# Patient Record
Sex: Female | Born: 1975 | Race: White | Hispanic: Yes | Marital: Single | Smoking: Never smoker
Health system: Southern US, Community
[De-identification: ages and names within clinical notes are randomized; demographics above are authoritative.]

## PROBLEM LIST (undated history)

## (undated) DIAGNOSIS — J45909 Unspecified asthma, uncomplicated: Secondary | ICD-10-CM

## (undated) DIAGNOSIS — I10 Essential (primary) hypertension: Secondary | ICD-10-CM

## (undated) HISTORY — PX: APPENDECTOMY: SHX54

## (undated) HISTORY — PX: TUBAL LIGATION: SHX77

---

## 2001-10-30 ENCOUNTER — Inpatient Hospital Stay (HOSPITAL_COMMUNITY): Admission: AD | Admit: 2001-10-30 | Discharge: 2001-10-30 | Payer: Self-pay | Admitting: *Deleted

## 2001-12-20 ENCOUNTER — Ambulatory Visit (HOSPITAL_COMMUNITY): Admission: RE | Admit: 2001-12-20 | Discharge: 2001-12-20 | Payer: Self-pay | Admitting: *Deleted

## 2002-01-31 ENCOUNTER — Encounter: Payer: Self-pay | Admitting: *Deleted

## 2002-01-31 ENCOUNTER — Ambulatory Visit (HOSPITAL_COMMUNITY): Admission: RE | Admit: 2002-01-31 | Discharge: 2002-01-31 | Payer: Self-pay | Admitting: *Deleted

## 2002-03-28 ENCOUNTER — Inpatient Hospital Stay (HOSPITAL_COMMUNITY): Admission: AD | Admit: 2002-03-28 | Discharge: 2002-03-31 | Payer: Self-pay | Admitting: *Deleted

## 2002-04-22 ENCOUNTER — Emergency Department (HOSPITAL_COMMUNITY): Admission: EM | Admit: 2002-04-22 | Discharge: 2002-04-22 | Payer: Self-pay

## 2003-01-30 ENCOUNTER — Emergency Department (HOSPITAL_COMMUNITY): Admission: EM | Admit: 2003-01-30 | Discharge: 2003-01-30 | Payer: Self-pay | Admitting: Emergency Medicine

## 2005-01-24 ENCOUNTER — Ambulatory Visit (HOSPITAL_COMMUNITY): Admission: RE | Admit: 2005-01-24 | Discharge: 2005-01-24 | Payer: Self-pay | Admitting: *Deleted

## 2005-02-28 ENCOUNTER — Inpatient Hospital Stay (HOSPITAL_COMMUNITY): Admission: RE | Admit: 2005-02-28 | Discharge: 2005-02-28 | Payer: Self-pay | Admitting: *Deleted

## 2005-06-20 ENCOUNTER — Inpatient Hospital Stay (HOSPITAL_COMMUNITY): Admission: AD | Admit: 2005-06-20 | Discharge: 2005-06-20 | Payer: Self-pay | Admitting: Obstetrics and Gynecology

## 2005-06-21 ENCOUNTER — Ambulatory Visit: Payer: Self-pay | Admitting: Family Medicine

## 2005-07-19 ENCOUNTER — Ambulatory Visit: Payer: Self-pay | Admitting: *Deleted

## 2005-07-19 ENCOUNTER — Inpatient Hospital Stay (HOSPITAL_COMMUNITY): Admission: AD | Admit: 2005-07-19 | Discharge: 2005-07-19 | Payer: Self-pay | Admitting: Family Medicine

## 2005-07-24 ENCOUNTER — Ambulatory Visit: Payer: Self-pay | Admitting: Obstetrics & Gynecology

## 2005-07-24 ENCOUNTER — Inpatient Hospital Stay (HOSPITAL_COMMUNITY): Admission: AD | Admit: 2005-07-24 | Discharge: 2005-07-27 | Payer: Self-pay | Admitting: Obstetrics and Gynecology

## 2005-07-24 ENCOUNTER — Ambulatory Visit (HOSPITAL_COMMUNITY): Admission: RE | Admit: 2005-07-24 | Discharge: 2005-07-24 | Payer: Self-pay | Admitting: *Deleted

## 2005-07-24 ENCOUNTER — Ambulatory Visit: Payer: Self-pay | Admitting: Certified Nurse Midwife

## 2006-11-29 IMAGING — US US OB COMP +14 WK
1 series · 13 of 28 positions shown · non-contrast
Comparison: none

CLINICAL DATA: 15 week 0 day gestational age by LMP.  Measuring large for dates. Evaluate fetal number and dating.

[Series 1: us ob comp +14 wk · 13 of 46 slices shown]
[im 2/46]
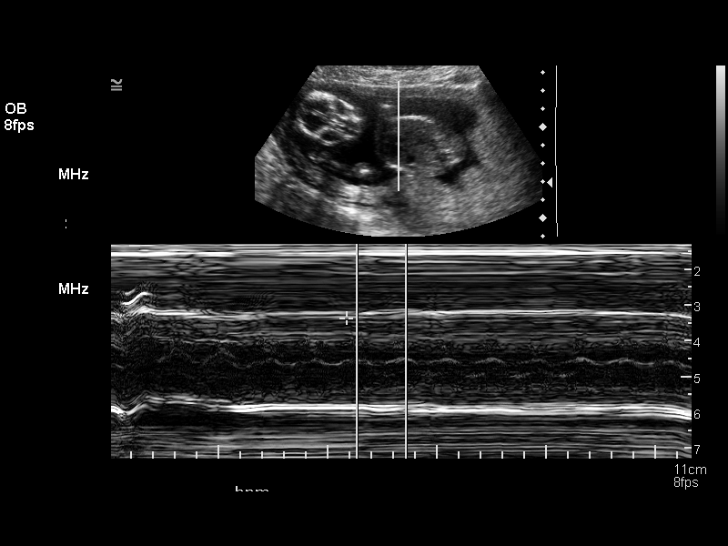
[im 6/46]
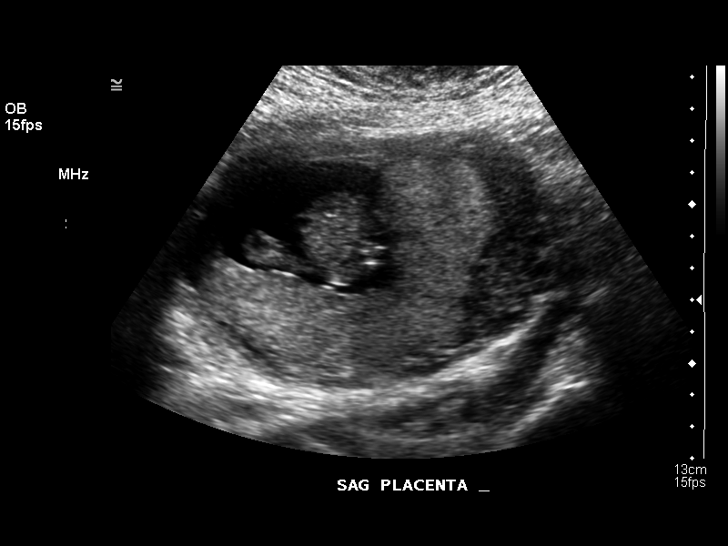
[im 9/46]
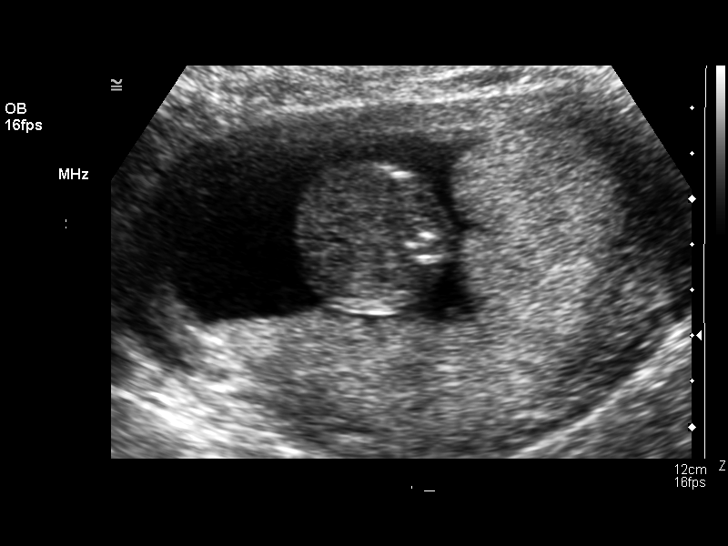
[im 12/46]
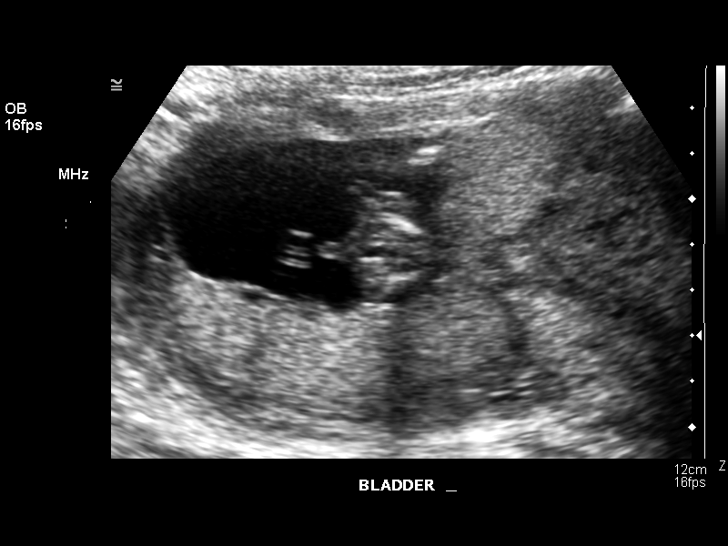
[im 16/46]
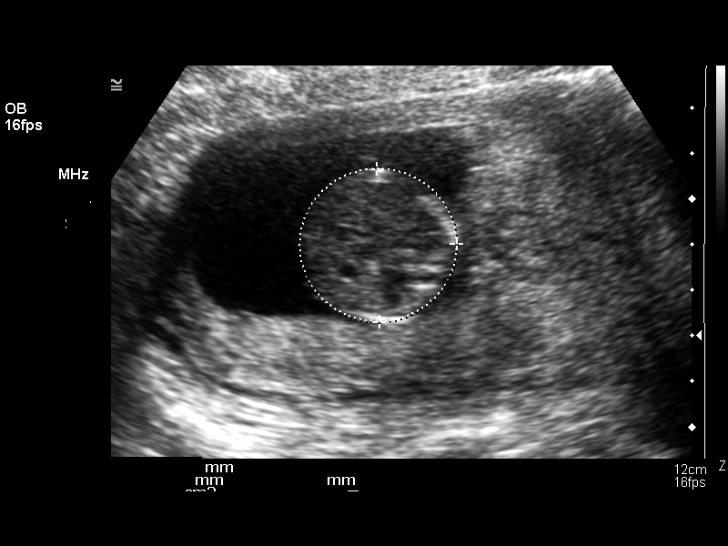
[im 19/46]
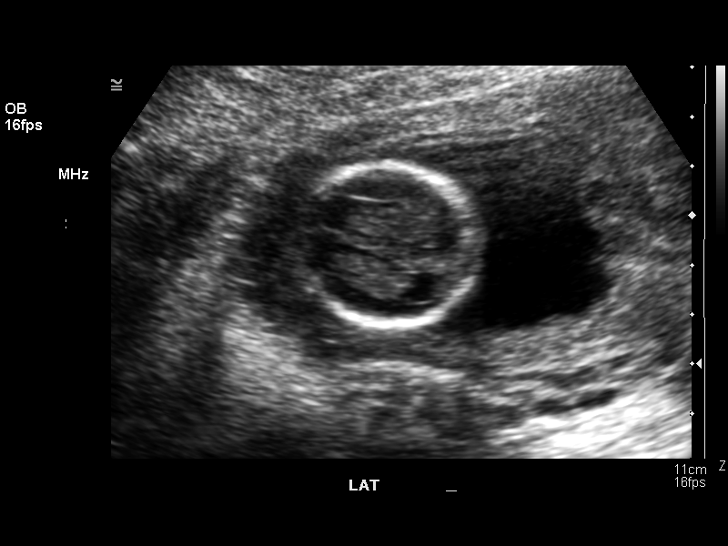
[im 24/46]
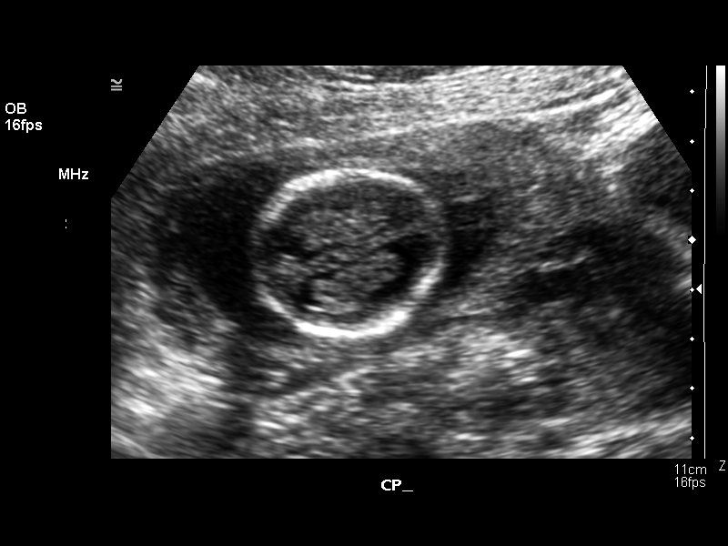
[im 27/46]
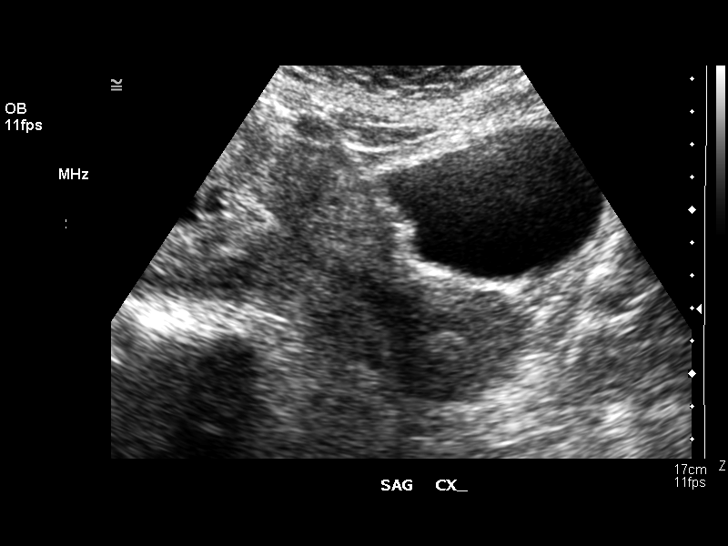
[im 31/46]
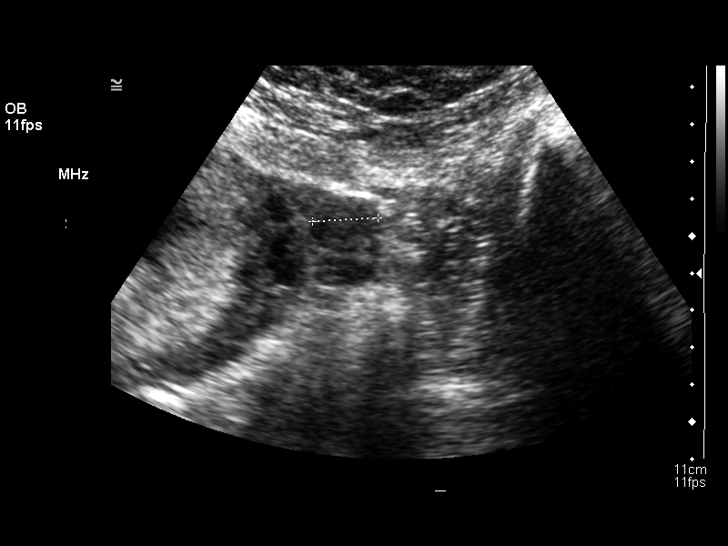
[im 34/46]
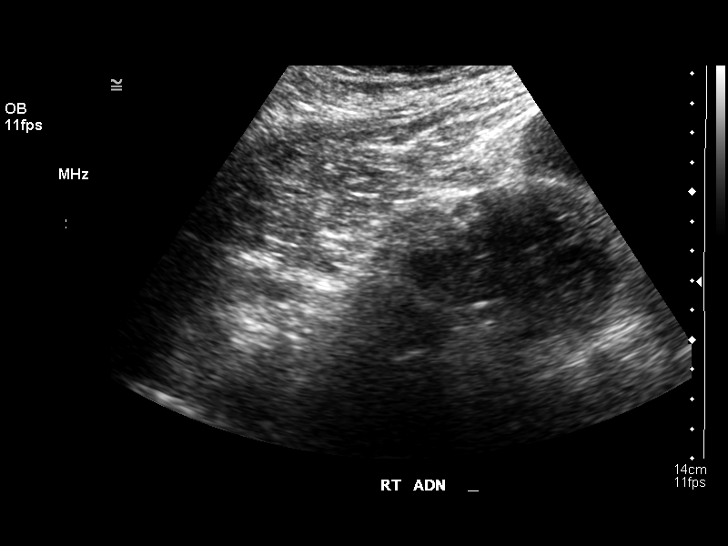
[im 37/46]
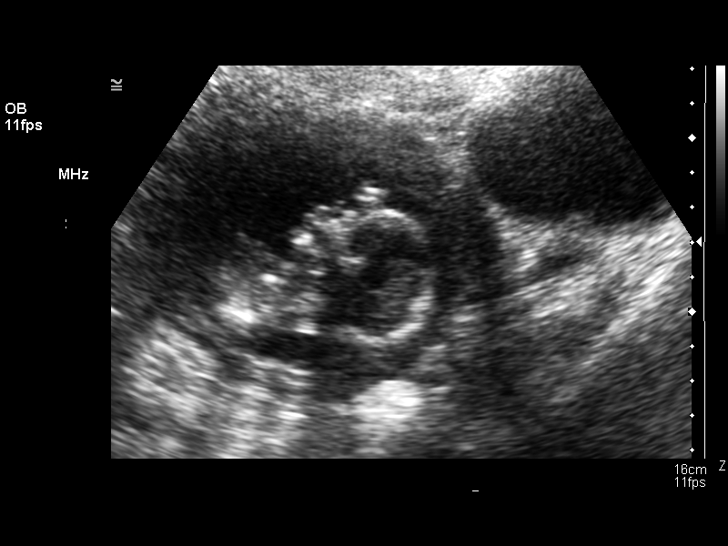
[im 41/46]
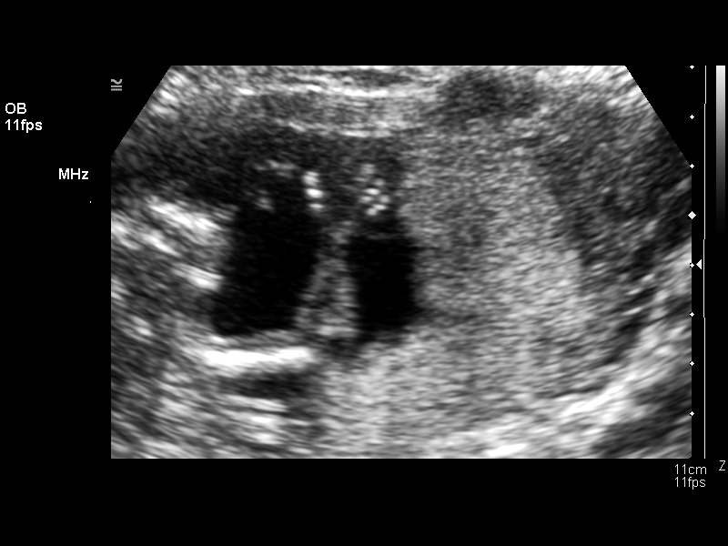
[im 44/46]
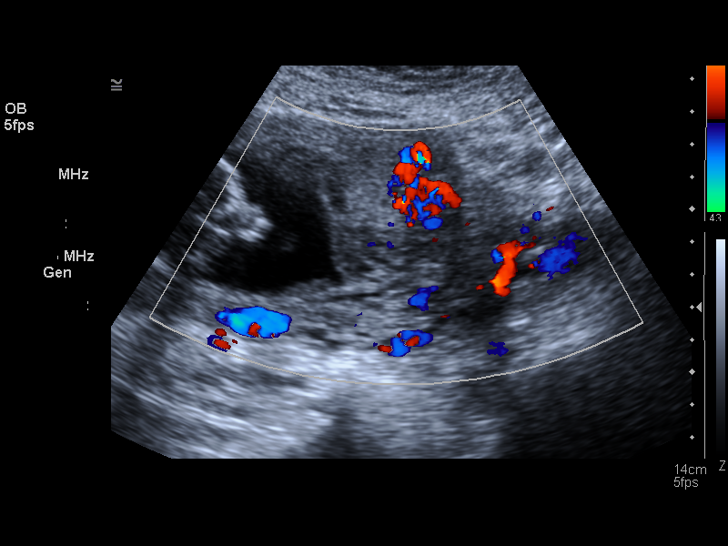

[13 of 28 positions shown; findings below may reference images not displayed]

OBSTETRICAL ULTRASOUND:
 Number of Fetuses:  1
 Heart Rate:  133
 Movement:  Yes
 Breathing:    No  
 Presentation:  Transverse with head to maternal right
 Placental Location:  Posterior, left lateral 
 Grade:  I
 Previa:  Complete
 Amniotic Fluid (Subjective):  Normal
 Amniotic Fluid (Objective):   3.4 cm Vertical pocket 

 FETAL BIOMETRY
 BPD:   3.4 cm   16 w 3 d
 HC:   12.3 cm   16 w 1 d
 AC:   11.0 cm  16 w 5 d
 FL:    1.9 cm   15 w 4 d

 MEAN GA:  16 w 2 d

 FETAL ANATOMY
 Lateral Ventricles:    Visualized 
 Thalami/CSP:      Visualized 
 Posterior Fossa:  Visualized 
 Nuchal Region:    Not visualized 
 Spine:      Not visualized 
 4 Chamber Heart on Left:      Not visualized 
 Stomach on Left:      Visualized 
 3 Vessel Cord:    Visualized 
 Cord Insertion site:    Visualized 
 Kidneys:  Visualized 
 Bladder:  Visualized 
 Extremities:      Not visualized 

 ADDITIONAL ANATOMY VISUALIZED:  Diaphragm, 5th digit, and male genitalia.
 Evaluation limited by:  Early gestational age.

 MATERNAL UTERINE AND ADNEXAL FINDINGS
 Cervix:   5.6 cm Transabdominally
 The left ovary is visualized and is normal in appearance.  The right ovary was not directly visualized, but no right sided adnexal masses are seen.  A small fibroid is noted within the anterior uterine wall measuring approximately 1.3 x 0.9 cm.
IMPRESSION: 1.  Single living intrauterine fetus with mean gestational age of 16 weeks 2 days and sonographic EDC of 07/09/05.  This is approximately 9 days ahead of LMP.
 2.  Limited anatomic evaluation possible due to early gestational age.  No early fetal abnormality identified.  Anatomic evaluation by ultrasound could be performed optimally between 18 and 20 weeks.  
 3.  Small anterior uterine fibroid measuring approximately 1.3 cm.  

 </u12:p>

## 2006-12-16 ENCOUNTER — Ambulatory Visit: Payer: Self-pay | Admitting: Obstetrics and Gynecology

## 2007-01-14 ENCOUNTER — Ambulatory Visit (HOSPITAL_COMMUNITY): Admission: RE | Admit: 2007-01-14 | Discharge: 2007-01-14 | Payer: Self-pay | Admitting: *Deleted

## 2007-01-14 ENCOUNTER — Ambulatory Visit: Payer: Self-pay | Admitting: Obstetrics and Gynecology

## 2007-01-28 ENCOUNTER — Ambulatory Visit: Payer: Self-pay | Admitting: Gynecology

## 2007-04-19 ENCOUNTER — Emergency Department (HOSPITAL_COMMUNITY): Admission: EM | Admit: 2007-04-19 | Discharge: 2007-04-20 | Payer: Self-pay | Admitting: Emergency Medicine

## 2011-04-11 NOTE — Op Note (Signed)
Elaine James, Elaine James  ACCOUNT NO.:  [id number]   MEDICAL RECORD NO.:  [id number]          PATIENT TYPE:  AMB   LOCATION:  SDC                           FACILITY:  [facility]   PHYSICIAN:  Phil D. Okey Dupre, M.D.     DATE OF BIRTH:  1976-02-13   DATE OF PROCEDURE:  01/14/2007  DATE OF DISCHARGE:                               OPERATIVE REPORT   PROCEDURE:  Laparoscopic sterilization.   PREOPERATIVE DIAGNOSIS:  Voluntary sterilization.   POSTOPERATIVE DIAGNOSES:  1. Voluntary sterilization.  2. Severe chronic pelvic inflammatory disease.   SURGEON:  Javier Glazier. Okey Dupre, M.D.   FIRST ASSISTANT:  Tracy L. Mayford Knife, M.D.   SPECIMENS:  None.   ANESTHESIA:  General.   POSTOPERATIVE CONDITION:  Satisfactory.   BLOOD LOSS:  Minimal.   OPERATIVE FINDINGS:  Upon entering the peritoneal cavity, the pelvic  organs visualized, there were multiple subserosal and intramural tiny  fibroids on both surfaces of the uterus, marked adhesions and  inflammatory cysts along the tubes and the mesosalpinx with both tubes  being slightly swollen in most areas, especially near the fimbriated end  with edema which I would probably term a partial hydrosalpinx, both  ovaries were plastered down behind the broad ligaments.  There was  marked adhesions on the anterior abdominal wall omentum.  The procedure  went as follows.   Under satisfactory general anesthesia, the patient in the dorsal  semilithotomy position, the perineum, vagina and abdomen prepped and  draped in the usual sterile manner.  Bimanual pelvic examination under  anesthesia revealed the uterus of normal size, shape, consistency,  anterior flexed, freely movable with a free cul-de-sac.  Weighted  speculum was placed in the posterior fourchette of the vagina and the  anterior lip of the cervix grasped with a single-tooth tenaculum, acorn  cannula placed and the cervix attached to the tenaculum for mobilization  of the uterus.  The speculum  was removed.  A 1 cm transverse incision  made just below the umbilicus and the Veress needle inserted into the  peritoneal cavity, however, whenever we tried with multiple attempts to  insufflate, we got wide variation, ups and downs of the pressure, and  decided we must have some adhesions there.  We took out the Veress  needle in one end with the scope trocar removed the scope from the  sleeve and noticed that we were in the peritoneal cavity __________ the  scope in but there were multiple adhesions.  The CO2 was then  insufflated into the peritoneal cavity until visualization became clear  and the findings were as above.  On a normal-appearing portion of the of  each tube on the right towards the mid portion and on the left closer to  the fimbriated end, a Filshie clamp was placed on each tube.  The areas  observed. There was no sign of any bleeding from the previous injections  were entry attempts and  the scope was removed and as much CO2 as possible expressed through the  sleeve.  The sleeve removed and the incision closed with a 3-0 Vicryl  suture and Dermaplast.  The tenaculum removed from the cervix and the  patient transferred to the recovery room in satisfactory condition  having tolerated the procedure well.           ______________________________  Javier Glazier. Okey Dupre, M.D.     PDR/MEDQ  D:  01/14/2007  T:  01/14/2007  Job:  [id number]

## 2013-11-25 ENCOUNTER — Emergency Department (HOSPITAL_COMMUNITY)
Admission: EM | Admit: 2013-11-25 | Discharge: 2013-11-25 | Disposition: A | Discharge status: Home / Self Care | Payer: No Typology Code available for payment source | Attending: Emergency Medicine | Admitting: Emergency Medicine

## 2013-11-25 ENCOUNTER — Encounter (HOSPITAL_COMMUNITY): Payer: Self-pay | Admitting: Emergency Medicine

## 2013-11-25 DIAGNOSIS — N390 Urinary tract infection, site not specified: Secondary | ICD-10-CM | POA: Insufficient documentation

## 2013-11-25 DIAGNOSIS — Z79899 Other long term (current) drug therapy: Secondary | ICD-10-CM | POA: Insufficient documentation

## 2013-11-25 DIAGNOSIS — N920 Excessive and frequent menstruation with regular cycle: Secondary | ICD-10-CM | POA: Insufficient documentation

## 2013-11-25 DIAGNOSIS — Z3202 Encounter for pregnancy test, result negative: Secondary | ICD-10-CM | POA: Insufficient documentation

## 2013-11-25 LAB — URINALYSIS, ROUTINE W REFLEX MICROSCOPIC
Bilirubin Urine: NEGATIVE
Glucose, UA: NEGATIVE mg/dL
KETONES UR: 15 mg/dL — AB
Nitrite: POSITIVE — AB
PH: 6 (ref 5.0–8.0)
PROTEIN: 100 mg/dL — AB
Specific Gravity, Urine: 1.014 (ref 1.005–1.030)
UROBILINOGEN UA: 0.2 mg/dL (ref 0.0–1.0)

## 2013-11-25 LAB — URINE MICROSCOPIC-ADD ON

## 2013-11-25 LAB — POCT PREGNANCY, URINE: Preg Test, Ur: NEGATIVE

## 2013-11-25 MED ORDER — CIPROFLOXACIN HCL 500 MG PO TABS: 500.0000 mg | ORAL_TABLET | Freq: Two times a day (BID) | ORAL | Status: DC

## 2013-11-25 MED ORDER — IBUPROFEN 800 MG PO TABS
800.0000 mg | ORAL_TABLET | Freq: Once | ORAL | Status: AC
  Administered 2013-11-25: 800 mg via ORAL
  Filled 2013-11-25: qty 1

## 2013-11-25 MED ORDER — CIPROFLOXACIN HCL 500 MG PO TABS
500.0000 mg | ORAL_TABLET | Freq: Once | ORAL | Status: AC
  Administered 2013-11-25: 500 mg via ORAL
  Filled 2013-11-25: qty 1

## 2013-11-25 NOTE — ED Notes (Signed)
Pt. reports hematuria / dysuria with hypogastric cramping onset this evening .

## 2013-11-25 NOTE — ED Provider Notes (Signed)
CSN: [id number]     Arrival date & time 11/25/13  1851 History   First MD Initiated Contact with Patient 11/25/13 2259     Chief Complaint  Patient presents with  . Hematuria   (Consider location/radiation/quality/duration/timing/severity/associated sxs/prior Treatment) The history is provided by the patient.  Elaine James is a 38 y.o. female colorless healthy here presenting with hematuria and dysuria. States that she her last as her period was December 24th and last about 3 days. The last 2 days she noticed her period came back and she is bleeding about 3-4 pads a day which is typical for her. She's been having some occasional cramps as well associated with it. Moreover she noticed some pain when she urinates and some frequency. Denies history of UTIs in the past. Denies flank pain or fever.    History reviewed. No pertinent past medical history. Past Surgical History  Procedure Laterality Date  . Appendectomy     No family history on file. History  Substance Use Topics  . Smoking status: Never Smoker   . Smokeless tobacco: Not on file  . Alcohol Use: No   OB History   Grav Para Term Preterm Abortions TAB SAB Ect Mult Living                 Review of Systems  Genitourinary: Positive for hematuria and vaginal bleeding.  All other systems reviewed and are negative.    Allergies  Review of patient's allergies indicates no known allergies.  Home Medications   Current Outpatient Rx  Name  Route  Sig  Dispense  Refill  . lisinopril-hydrochlorothiazide (PRINZIDE,ZESTORETIC) 10-12.5 MG per tablet   Oral   Take 1 tablet by mouth daily.         . Multiple Vitamin (MULTIVITAMIN WITH MINERALS) TABS tablet   Oral   Take 1 tablet by mouth daily.          BP 136/84  Pulse 96  Temp(Src) 99.2 F (37.3 C) (Oral)  Resp 18  Ht 5\' 5"  (1.651 m)  Wt 200 lb 6 oz (90.89 kg)  BMI 33.34 kg/m2  SpO2 100%  LMP 11/16/2013 Physical Exam  Nursing note and vitals  reviewed. Constitutional: She appears well-developed and well-nourished.  HENT:  Head: Normocephalic.  Mouth/Throat: Oropharynx is clear and moist.  Eyes: Conjunctivae and EOM are normal. Pupils are equal, round, and reactive to light.  Neck: Normal range of motion. Neck supple.  Cardiovascular: Normal rate, regular rhythm and normal heart sounds.   Pulmonary/Chest: Effort normal and breath sounds normal. No respiratory distress. She has no wheezes.  Abdominal: Soft. Bowel sounds are normal.  + suprapubic tenderness. No CVAT   Genitourinary:  No blood at vault, no CMT or uterine or adnexal tenderness   Musculoskeletal: Normal range of motion.  Neurological: She is alert.  Skin: Skin is warm and dry.  Psychiatric: She has a normal mood and affect. Her behavior is normal. Judgment normal.    ED Course  Procedures (including critical care time) Labs Review Labs Reviewed  URINALYSIS, ROUTINE W REFLEX MICROSCOPIC - Abnormal; Notable for the following:    APPearance TURBID (*)    Hgb urine dipstick LARGE (*)    Ketones, ur 15 (*)    Protein, ur 100 (*)    Nitrite POSITIVE (*)    Leukocytes, UA LARGE (*)    All other components within normal limits  URINE MICROSCOPIC-ADD ON - Abnormal; Notable for the following:    Bacteria, UA  MANY (*)    All other components within normal limits  URINE CULTURE  POCT PREGNANCY, URINE   Imaging Review No results found.  EKG Interpretation   None       MDM  No diagnosis found. Elaine James is a 38 y.o. female here with menorrhagia and dysuria. Hematuria likely from menstruation and unlikely from kidney stone. UA + UTI so will give abx.   11:23 PM She is slightly tachy on arrival but tolerated PO fluids and tachycardia resolved. Will give a course of cipro.    Richardean Canalavid H Meda Dudzinski, MD 11/25/13 [phone]

## 2013-11-25 NOTE — Discharge Instructions (Signed)
Take cipro twice a day for a week.  Take motrin for pain.   You may still have some vaginal bleeding for several days.   Follow up with your doctor.   Return to ER if you have severe pain, vomiting, fever.

## 2013-11-27 LAB — URINE CULTURE: Colony Count: >=100000

## 2019-10-01 ENCOUNTER — Encounter (HOSPITAL_COMMUNITY): Payer: Self-pay

## 2019-10-01 ENCOUNTER — Ambulatory Visit (INDEPENDENT_AMBULATORY_CARE_PROVIDER_SITE_OTHER): Payer: BLUE CROSS/BLUE SHIELD

## 2019-10-01 ENCOUNTER — Ambulatory Visit (HOSPITAL_COMMUNITY)
Admission: EM | Admit: 2019-10-01 | Discharge: 2019-10-01 | Disposition: A | Discharge status: Home / Self Care | Payer: BLUE CROSS/BLUE SHIELD | Attending: Physician Assistant | Admitting: Physician Assistant

## 2019-10-01 ENCOUNTER — Other Ambulatory Visit: Payer: Self-pay

## 2019-10-01 DIAGNOSIS — S40011A Contusion of right shoulder, initial encounter: Secondary | ICD-10-CM | POA: Diagnosis not present

## 2019-10-01 DIAGNOSIS — M25511 Pain in right shoulder: Secondary | ICD-10-CM

## 2019-10-01 DIAGNOSIS — W19XXXA Unspecified fall, initial encounter: Secondary | ICD-10-CM

## 2019-10-01 MED ORDER — CYCLOBENZAPRINE HCL 5 MG PO TABS: 5.0000 mg | ORAL_TABLET | Freq: Three times a day (TID) | ORAL | 0 refills | Status: DC | PRN

## 2019-10-01 MED ORDER — NAPROXEN 500 MG PO TABS: 500.0000 mg | ORAL_TABLET | Freq: Two times a day (BID) | ORAL | 0 refills | Status: DC

## 2019-10-01 NOTE — ED Provider Notes (Signed)
MRN: [id number] DOB: November 13, 1976  Subjective:   Elaine James is a 43 y.o. female presenting for 9-day history of acute onset persistent moderate to severe mostly constant right shoulder pain with decreased range of motion.  Pain is very sharp and penetrating, can radiate down into her bicep medially.  Symptoms started from suffering an accidental fall while in the shower.  States that Elaine James landed on her right side, did not hit her head or have loss of consciousness.  Elaine James had a very big bruise and pain over her right thigh and hip which has since resolved.  However, her right shoulder pain has persisted.  Elaine James has been using ibuprofen with minimal relief.  No current facility-administered medications for this encounter.   Current Outpatient Medications:  .  beclomethasone (QVAR) 80 MCG/ACT inhaler, Inhale into the lungs., Disp: , Rfl:  .  docusate sodium (COLACE) 100 MG capsule, Take by mouth., Disp: , Rfl:  .  simvastatin (ZOCOR) 20 MG tablet, Take by mouth., Disp: , Rfl:  .  albuterol (VENTOLIN HFA) 108 (90 Base) MCG/ACT inhaler, SMARTSIG:2 Puff(s) By Mouth Every 4 Hours PRN, Disp: , Rfl:  .  ciprofloxacin (CIPRO) 500 MG tablet, Take 1 tablet (500 mg total) by mouth 2 (two) times daily. One po bid x 7 days, Disp: 14 tablet, Rfl: 0 .  Ferrous Sulfate (IRON) 325 (65 Fe) MG TABS, Take 1 tablet by mouth daily., Disp: , Rfl:  .  lisinopril-hydrochlorothiazide (PRINZIDE,ZESTORETIC) 10-12.5 MG per tablet, Take 1 tablet by mouth daily., Disp: , Rfl:  .  montelukast (SINGULAIR) 10 MG tablet, Take 10 mg by mouth at bedtime., Disp: , Rfl:  .  Multiple Vitamin (MULTIVITAMIN WITH MINERALS) TABS tablet, Take 1 tablet by mouth daily., Disp: , Rfl:    No Known Allergies  History reviewed. No pertinent past medical history.   Past Surgical History:  Procedure Laterality Date  . APPENDECTOMY      ROS Denies fever, bony deformity, ecchymosis, warmth, redness.  Objective:   Vitals: BP  121/82 (BP Location: Left Arm)   Pulse 75   Temp 97.9 F (36.6 C) (Oral)   Resp 15   LMP 09/26/2019 (Exact Date)   SpO2 97%   Physical Exam Constitutional:      General: Elaine James is not in acute distress.    Appearance: Normal appearance. Elaine James is well-developed. Elaine James is not ill-appearing.  HENT:     Head: Normocephalic and atraumatic.     Nose: Nose normal.     Mouth/Throat:     Mouth: Mucous membranes are moist.     Pharynx: Oropharynx is clear.  Eyes:     General: No scleral icterus.    Extraocular Movements: Extraocular movements intact.     Pupils: Pupils are equal, round, and reactive to light.  Cardiovascular:     Rate and Rhythm: Normal rate.  Pulmonary:     Effort: Pulmonary effort is normal.  Musculoskeletal:     Right shoulder: Elaine James exhibits decreased range of motion (in all directions, worse with internal and external rotation, abduction) and tenderness (Anterior portion of right shoulder, worse over anterior and lateral deltoid). Elaine James exhibits no swelling, no effusion, no crepitus, no deformity, no laceration, no spasm and normal strength.  Skin:    General: Skin is warm and dry.  Neurological:     General: No focal deficit present.     Mental Status: Elaine James is alert and oriented to person, place, and time.  Psychiatric:  Mood and Affect: Mood normal.        Behavior: Behavior normal.     Dg Shoulder Right  Result Date: 10/01/2019 CLINICAL DATA:  43 year old female with a history right shoulder injury EXAM: RIGHT SHOULDER - 2+ VIEW COMPARISON:  None. FINDINGS: There is no evidence of fracture or dislocation. There is no evidence of arthropathy or other focal bone abnormality. Soft tissues are unremarkable. IMPRESSION: Negative. Electronically Signed   By: Gilmer Mor D.O.   On: 10/01/2019 12:46    Assessment and Plan :   1. Acute pain of right shoulder   2. Accidental fall, initial encounter   3. Contusion of right shoulder, initial encounter     We will  manage conservatively for shoulder contusion associated with the fall.  Counseled on use of NSAID, muscle relaxant and modification of physical activity.  Anticipatory guidance provided.  Provided patient with information to several orthopedic practices to follow-up with if her symptoms persist.  Counseled patient on potential for adverse effects with medications prescribed/recommended today, ER and return-to-clinic precautions discussed, patient verbalized understanding.    Wallis Bamberg, PA-C 10/01/19 1253

## 2019-10-01 NOTE — ED Triage Notes (Signed)
Pt reports she was cleaning the shower 9 days ago, she slipped and fell and injured her right shoulder with the shower. Pt states she has right arm pain and right shoulder shoulder pain with internal rotation, flexion and extension of tthe right arm,

## 2019-11-12 ENCOUNTER — Other Ambulatory Visit: Payer: Self-pay

## 2019-11-12 ENCOUNTER — Ambulatory Visit (HOSPITAL_COMMUNITY)
Admission: EM | Admit: 2019-11-12 | Discharge: 2019-11-12 | Disposition: A | Discharge status: Home / Self Care | Payer: BLUE CROSS/BLUE SHIELD | Attending: Urgent Care | Admitting: Urgent Care

## 2019-11-12 ENCOUNTER — Encounter (HOSPITAL_COMMUNITY): Payer: Self-pay | Admitting: Urgent Care

## 2019-11-12 DIAGNOSIS — I10 Essential (primary) hypertension: Secondary | ICD-10-CM | POA: Insufficient documentation

## 2019-11-12 DIAGNOSIS — R05 Cough: Secondary | ICD-10-CM | POA: Insufficient documentation

## 2019-11-12 DIAGNOSIS — R002 Palpitations: Secondary | ICD-10-CM | POA: Insufficient documentation

## 2019-11-12 DIAGNOSIS — R0789 Other chest pain: Secondary | ICD-10-CM | POA: Diagnosis not present

## 2019-11-12 DIAGNOSIS — Z79899 Other long term (current) drug therapy: Secondary | ICD-10-CM | POA: Insufficient documentation

## 2019-11-12 DIAGNOSIS — R000 Tachycardia, unspecified: Secondary | ICD-10-CM

## 2019-11-12 DIAGNOSIS — J4540 Moderate persistent asthma, uncomplicated: Secondary | ICD-10-CM | POA: Insufficient documentation

## 2019-11-12 DIAGNOSIS — R058 Other specified cough: Secondary | ICD-10-CM

## 2019-11-12 DIAGNOSIS — Z20828 Contact with and (suspected) exposure to other viral communicable diseases: Secondary | ICD-10-CM | POA: Insufficient documentation

## 2019-11-12 HISTORY — DX: Essential (primary) hypertension: I10

## 2019-11-12 HISTORY — DX: Unspecified asthma, uncomplicated: J45.909

## 2019-11-12 MED ORDER — ALBUTEROL SULFATE (2.5 MG/3ML) 0.083% IN NEBU: 2.5000 mg | INHALATION_SOLUTION | Freq: Four times a day (QID) | RESPIRATORY_TRACT | 0 refills | Status: DC | PRN

## 2019-11-12 MED ORDER — ALBUTEROL SULFATE HFA 108 (90 BASE) MCG/ACT IN AERS: 1.0000 | INHALATION_SPRAY | Freq: Four times a day (QID) | RESPIRATORY_TRACT | 0 refills | Status: DC | PRN

## 2019-11-12 MED ORDER — PREDNISONE 20 MG PO TABS: ORAL_TABLET | ORAL | 0 refills | Status: DC

## 2019-11-12 MED ORDER — BENZONATATE 100 MG PO CAPS: 100.0000 mg | ORAL_CAPSULE | Freq: Three times a day (TID) | ORAL | 0 refills | Status: DC | PRN

## 2019-11-12 MED ORDER — PROMETHAZINE-DM 6.25-15 MG/5ML PO SYRP: 5.0000 mL | ORAL_SOLUTION | Freq: Every evening | ORAL | 0 refills | Status: DC | PRN

## 2019-11-12 NOTE — Discharge Instructions (Signed)
Para el dolor de garganta o tos puede usar un t de miel. Use 3 cucharaditas de miel con jugo exprimido de [employer]. Coloque trozos de Pension scheme manager en 1/2-1 taza de agua y caliente sobre la estufa. Luego mezcle los ingredientes y repita cada 4 horas. Para dolores puede tomar ibuprofeno 400 mg cada 6 horas alternando con o junto con Tylenol 500 mg cada 6 horas. Hidrata muy bien con al menos 2 litros de agua al dia. Coma comidas ligeras como sopas para [employer] electrolitos y coma frutas, verduras. Comience un antihistamnico como Zyrtec (cetirizina) 10mg  al dia. Cuando termina el steroide (prednisone) puede usar pseudoefedrina (Sudafed) de venta libre para el goteo posnasal, congestin a una dosis de 60 mg cada 8 horas o 120 mg cada 12 horas. Use el jarabe por la noche para su tos y las capsulas durante el dia.

## 2019-11-12 NOTE — ED Provider Notes (Signed)
[facility]   MRN: [id number] DOB: 28-Apr-1976  Subjective:   Elaine James is a 43 y.o. female presenting for 3 week history of persistent intermittent dry cough worse at night. Has started to have mid-left sided chest pain at night. Has been using albuterol inhaler once every 4-6 hours. Needs refill of her nebulized albuterol.   No current facility-administered medications for this encounter.  Current Outpatient Medications:  .  albuterol (VENTOLIN HFA) 108 (90 Base) MCG/ACT inhaler, SMARTSIG:2 Puff(s) By Mouth Every 4 Hours PRN, Disp: , Rfl:  .  Ferrous Sulfate (IRON) 325 (65 Fe) MG TABS, Take 1 tablet by mouth daily., Disp: , Rfl:  .  lisinopril-hydrochlorothiazide (PRINZIDE,ZESTORETIC) 10-12.5 MG per tablet, Take 1 tablet by mouth daily., Disp: , Rfl:  .  montelukast (SINGULAIR) 10 MG tablet, Take 10 mg by mouth at bedtime., Disp: , Rfl:  .  Multiple Vitamin (MULTIVITAMIN WITH MINERALS) TABS tablet, Take 1 tablet by mouth daily., Disp: , Rfl:    No Known Allergies  Past Medical History:  Diagnosis Date  . Asthma   . Hypertension      Past Surgical History:  Procedure Laterality Date  . APPENDECTOMY      Family History  Problem Relation Age of Onset  . Healthy Mother   . Healthy Father     Social History   Tobacco Use  . Smoking status: Never Smoker  . Smokeless tobacco: Never Used  Substance Use Topics  . Alcohol use: No  . Drug use: No    Review of Systems  Constitutional: Positive for malaise/fatigue. Negative for fever.  HENT: Negative for congestion, ear pain, sinus pain and sore throat.   Eyes: Negative for discharge and redness.  Respiratory: Positive for cough and wheezing. Negative for hemoptysis and shortness of breath.   Cardiovascular: Positive for chest pain and palpitations.  Gastrointestinal: Negative for abdominal pain, diarrhea, nausea and vomiting.  Genitourinary: Negative for dysuria, flank pain and hematuria.   Musculoskeletal: Negative for myalgias.  Skin: Negative for rash.  Neurological: Negative for dizziness, weakness and headaches.  Psychiatric/Behavioral: Negative for depression and substance abuse.     Objective:   Vitals: BP 131/85   Pulse 75   Temp 98.7 F (37.1 C) (Oral)   Resp 16   LMP 11/10/2019 (Exact Date)   SpO2 100%   Physical Exam Constitutional:      General: She is not in acute distress.    Appearance: Normal appearance. She is well-developed. She is obese. She is not ill-appearing, toxic-appearing or diaphoretic.  HENT:     Head: Normocephalic and atraumatic.     Right Ear: Tympanic membrane and ear canal normal. No drainage or tenderness. No middle ear effusion. Tympanic membrane is not erythematous.     Left Ear: Tympanic membrane and ear canal normal. No drainage or tenderness.  No middle ear effusion. Tympanic membrane is not erythematous.     Nose: Nose normal. No congestion or rhinorrhea.     Mouth/Throat:     Mouth: Mucous membranes are moist. No oral lesions.     Pharynx: Oropharynx is clear. No pharyngeal swelling, oropharyngeal exudate, posterior oropharyngeal erythema or uvula swelling.     Tonsils: No tonsillar exudate or tonsillar abscesses.  Eyes:     Extraocular Movements: Extraocular movements intact.     Right eye: Normal extraocular motion.     Left eye: Normal extraocular motion.     Conjunctiva/sclera: Conjunctivae normal.     Pupils: Pupils are  equal, round, and reactive to light.  Cardiovascular:     Rate and Rhythm: Normal rate and regular rhythm.     Pulses: Normal pulses.     Heart sounds: Normal heart sounds. No murmur. No friction rub. No gallop.   Pulmonary:     Effort: Pulmonary effort is normal. No respiratory distress.     Breath sounds: Normal breath sounds. No stridor. No wheezing, rhonchi or rales.  Musculoskeletal:     Cervical back: Normal range of motion and neck supple.  Lymphadenopathy:     Cervical: No cervical  adenopathy.  Skin:    General: Skin is warm and dry.     Findings: No rash.  Neurological:     General: No focal deficit present.     Mental Status: She is alert and oriented to person, place, and time.  Psychiatric:        Mood and Affect: Mood normal.        Behavior: Behavior normal.        Thought Content: Thought content normal.     ED ECG REPORT   Date: 11/12/2019  Rate: 70bpm  Rhythm: normal sinus rhythm  QRS Axis: left  Intervals: normal  ST/T Wave abnormalities: nonspecific T wave changes  Conduction Disutrbances:none  Narrative Interpretation: Nonspecific T wave flattening in lead III but otherwise sinus rhythm at 70 bpm.  No previous EKG for comparison.  Old EKG Reviewed: none available  I have personally reviewed the EKG tracing and agree with the computerized printout as noted.   Assessment and Plan :   1. Moderate persistent asthma without complication   2. Dry cough   3. Racing heart beat   4. Palpitations   5. Atypical chest pain     Will have patient use a short steroid course, refilled her nebulized albuterol and albuterol inhaler.  Patient is to maintain Singulair and recheck with her PCP about getting refill of inhaled steroid.  Will otherwise have patient use cough suppression medications, start Zyrtec and use pseudoephedrine (after she is finished with prednisone course). Counseled patient on potential for adverse effects with medications prescribed/recommended today, ER and return-to-clinic precautions discussed, patient verbalized understanding.    Jaynee Eagles, PA-C 11/12/19 1053

## 2019-11-12 NOTE — ED Triage Notes (Signed)
C/O asthmatic cough x 3 wks.  Over past several days c/o intermittent left chest pains with sensation of heart pounding.  Has been using inhalers.  Denies fever or runny nose.

## 2019-11-13 LAB — NOVEL CORONAVIRUS, NAA (HOSP ORDER, SEND-OUT TO REF LAB; TAT 18-24 HRS): SARS-CoV-2, NAA: NOT DETECTED

## 2020-04-14 ENCOUNTER — Ambulatory Visit
Admission: EM | Admit: 2020-04-14 | Discharge: 2020-04-14 | Disposition: A | Discharge status: Home / Self Care | Payer: 59

## 2020-04-14 ENCOUNTER — Encounter: Payer: Self-pay | Admitting: Emergency Medicine

## 2020-04-14 ENCOUNTER — Other Ambulatory Visit: Payer: Self-pay

## 2020-04-14 DIAGNOSIS — R519 Headache, unspecified: Secondary | ICD-10-CM | POA: Diagnosis not present

## 2020-04-14 DIAGNOSIS — J4531 Mild persistent asthma with (acute) exacerbation: Secondary | ICD-10-CM | POA: Diagnosis not present

## 2020-04-14 DIAGNOSIS — J302 Other seasonal allergic rhinitis: Secondary | ICD-10-CM | POA: Diagnosis not present

## 2020-04-14 DIAGNOSIS — R05 Cough: Secondary | ICD-10-CM | POA: Diagnosis not present

## 2020-04-14 DIAGNOSIS — R059 Cough, unspecified: Secondary | ICD-10-CM

## 2020-04-14 MED ORDER — ALBUTEROL SULFATE HFA 108 (90 BASE) MCG/ACT IN AERS: 1.0000 | INHALATION_SPRAY | Freq: Four times a day (QID) | RESPIRATORY_TRACT | 0 refills | Status: DC | PRN

## 2020-04-14 MED ORDER — BUDESONIDE-FORMOTEROL FUMARATE 80-4.5 MCG/ACT IN AERO
2.0000 | INHALATION_SPRAY | Freq: Two times a day (BID) | RESPIRATORY_TRACT | 0 refills | Status: DC
Start: 2020-04-14 — End: 2021-06-06

## 2020-04-14 MED ORDER — AEROCHAMBER PLUS FLO-VU MEDIUM MISC: 1.0000 | Freq: Once | 0 refills | Status: AC

## 2020-04-14 MED ORDER — MONTELUKAST SODIUM 10 MG PO TABS: 10.0000 mg | ORAL_TABLET | Freq: Every day | ORAL | 0 refills | Status: DC

## 2020-04-14 MED ORDER — PREDNISONE 20 MG PO TABS
20.0000 mg | ORAL_TABLET | Freq: Every day | ORAL | 0 refills | Status: DC
Start: 2020-04-14 — End: 2020-07-26

## 2020-04-14 MED ORDER — BENZONATATE 100 MG PO CAPS
100.0000 mg | ORAL_CAPSULE | Freq: Three times a day (TID) | ORAL | 0 refills | Status: DC
Start: 2020-04-14 — End: 2020-07-26

## 2020-04-14 NOTE — ED Triage Notes (Addendum)
2 day history of headache, initially pain was on right side of head, now entire forehead.  No runny nose.  Patient complains of cough and asthma exacerbation.  Patient complains of allergies and sneezing a lot.  When patient coughs, forehead hurts  Chest feels "tired".   Patient requesting refills on medicines.  Reports she had a provider in [place], but no longer has a provider.    Patient complains about right elbow and right knee discomfort

## 2020-04-14 NOTE — Discharge Instructions (Signed)
por favor llame a nuestra oficina si tiene [place] pregunta

## 2020-04-14 NOTE — ED Provider Notes (Signed)
[facility]    CSN: [id number] Arrival date & time: 04/14/20  1044      History   Chief Complaint Chief Complaint  Patient presents with  . Shortness of Breath    HPI Elaine James is a 44 y.o. female with history of asthma, hypertension presenting for concern for asthma exacerbation.  Video [employer] interpreter services used.  Patient states for the last few days she has had generalized headache, nasal congestion, dry cough, wheezing, difficulty breathing.  Denies chest pain, palpitations, lower extreme edema.  No recent change in medications.  States she did run out of her Symbicort: Requesting refill.  Does have albuterol at home, though no spacer.  Patient denies known sick contacts.  No fever, arthralgias, myalgias.   Past Medical History:  Diagnosis Date  . Asthma   . Hypertension     There are no problems to display for this patient.   Past Surgical History:  Procedure Laterality Date  . APPENDECTOMY    . TUBAL LIGATION      OB History    Gravida  2   Para  2   Term  2   Preterm      AB      Living  2     SAB      TAB      Ectopic      Multiple      Live Births               Home Medications    Prior to Admission medications   Medication Sig Start Date End Date Taking? Authorizing Provider  cetirizine (ZYRTEC) 10 MG tablet Take 10 mg by mouth daily.   Yes [provider]  lisinopril-hydrochlorothiazide (PRINZIDE,ZESTORETIC) 10-12.5 MG per tablet Take 1 tablet by mouth daily.   Yes [provider]  albuterol (PROVENTIL) (2.5 MG/3ML) 0.083% nebulizer solution Take 3 mLs (2.5 mg total) by nebulization every 6 (six) hours as needed for wheezing or shortness of breath. 11/12/19   Wallis Bamberg, PA-C  albuterol (VENTOLIN HFA) 108 (90 Base) MCG/ACT inhaler Inhale 1-2 puffs into the lungs every 6 (six) hours as needed for wheezing or shortness of breath. 04/14/20   Hall-Potvin, [place], PA-C   benzonatate (TESSALON) 100 MG capsule Take 1 capsule (100 mg total) by mouth every 8 (eight) hours. 04/14/20   Hall-Potvin, [place], PA-C  budesonide-formoterol (SYMBICORT) 80-4.5 MCG/ACT inhaler Inhale 2 puffs into the lungs in the morning and at bedtime. 04/14/20   Hall-Potvin, [place], PA-C  Ferrous Sulfate (IRON) 325 (65 Fe) MG TABS Take 1 tablet by mouth daily. 09/10/19   [provider]  montelukast (SINGULAIR) 10 MG tablet Take 1 tablet (10 mg total) by mouth at bedtime. 04/14/20   Hall-Potvin, [place], PA-C  Multiple Vitamin (MULTIVITAMIN WITH MINERALS) TABS tablet Take 1 tablet by mouth daily.    [provider]  predniSONE (DELTASONE) 20 MG tablet Take 1 tablet (20 mg total) by mouth daily. 04/14/20   Hall-Potvin, [place], PA-C  Spacer/Aero-Holding Chambers (AEROCHAMBER PLUS FLO-VU MEDIUM) MISC 1 each by Other route once for 1 dose. 04/14/20 04/14/20  Hall-Potvin, [place], PA-C  beclomethasone (QVAR) 80 MCG/ACT inhaler Inhale into the lungs. 08/17/19 11/12/19  [provider]  simvastatin (ZOCOR) 20 MG tablet Take by mouth. 08/17/19 11/12/19  [provider]    Family History Family History  Problem Relation Age of Onset  . Healthy Mother   . Healthy Father     Social History  Social History   Tobacco Use  . Smoking status: Never Smoker  . Smokeless tobacco: Never Used  Substance Use Topics  . Alcohol use: No  . Drug use: No     Allergies   Patient has no known allergies.   Review of Systems As per HPI   Physical Exam Triage Vital Signs ED Triage Vitals  Enc Vitals Group     BP      Pulse      Resp      Temp      Temp src      SpO2      Weight      Height      Head Circumference      Peak Flow      Pain Score      Pain Loc      Pain Edu?      Excl. in GC?    No data found.  Updated Vital Signs BP (!) 154/96 (BP Location: Left Arm)   Pulse 79   Temp 98 F (36.7 C) (Oral)   Resp 18   LMP 04/08/2020   SpO2 97%    Visual Acuity Right Eye Distance:   Left Eye Distance:   Bilateral Distance:    Right Eye Near:   Left Eye Near:    Bilateral Near:     Physical Exam Constitutional:      General: She is not in acute distress.    Appearance: She is well-developed. She is not ill-appearing.  HENT:     Head: Normocephalic and atraumatic.     Mouth/Throat:     Mouth: Mucous membranes are moist.     Pharynx: Oropharynx is clear.  Eyes:     General: No scleral icterus.    Pupils: Pupils are equal, round, and reactive to light.  Cardiovascular:     Rate and Rhythm: Normal rate and regular rhythm.  Pulmonary:     Effort: Pulmonary effort is normal. No tachypnea, accessory muscle usage or respiratory distress.     Breath sounds: Wheezing present. No rales.  Skin:    Coloration: Skin is not jaundiced or pale.  Neurological:     Mental Status: She is alert and oriented to person, place, and time.      UC Treatments / Results  Labs (all labs ordered are listed, but only abnormal results are displayed) Labs Reviewed  NOVEL CORONAVIRUS, NAA    EKG   Radiology No results found.  Procedures Procedures (including critical care time)  Medications Ordered in UC Medications - No data to display  Initial Impression / Assessment and Plan / UC Course  I have reviewed the triage vital signs and the nursing notes.  Pertinent labs & imaging results that were available during my care of the patient were reviewed by me and considered in my medical decision making (see chart for details).     Patient afebrile, nontoxic, with SpO2 97%.  Covid PCR pending.  Patient to quarantine until results are back.  We will treat supportively as outlined below.  Return precautions discussed, patient verbalized understanding and is agreeable to plan. Final Clinical Impressions(s) / UC Diagnoses   Final diagnoses:  Seasonal allergies  Cough  Frontal headache  Allergic asthma, mild persistent, with acute  exacerbation     Discharge Instructions     por favor llame a nuestra oficina si tiene [place] pregunta    ED Prescriptions    Medication Sig Dispense Auth. Provider  budesonide-formoterol (SYMBICORT) 80-4.5 MCG/ACT inhaler Inhale 2 puffs into the lungs in the morning and at bedtime. 1 Inhaler Hall-Potvin, [place], PA-C   albuterol (VENTOLIN HFA) 108 (90 Base) MCG/ACT inhaler Inhale 1-2 puffs into the lungs every 6 (six) hours as needed for wheezing or shortness of breath. 18 g Hall-Potvin, [place], PA-C   Spacer/Aero-Holding Chambers (AEROCHAMBER PLUS FLO-VU MEDIUM) MISC 1 each by Other route once for 1 dose. 1 each Hall-Potvin, [place], PA-C   benzonatate (TESSALON) 100 MG capsule Take 1 capsule (100 mg total) by mouth every 8 (eight) hours. 21 capsule Hall-Potvin, [place], PA-C   predniSONE (DELTASONE) 20 MG tablet Take 1 tablet (20 mg total) by mouth daily. 5 tablet Hall-Potvin, [place], PA-C   montelukast (SINGULAIR) 10 MG tablet Take 1 tablet (10 mg total) by mouth at bedtime. 30 tablet Hall-Potvin, [place], PA-C     PDMP not reviewed this encounter.   Hall-Potvin, [place], [place] 04/14/20 1443

## 2020-04-15 LAB — NOVEL CORONAVIRUS, NAA: SARS-CoV-2, NAA: NOT DETECTED

## 2020-04-15 LAB — SARS-COV-2, NAA 2 DAY TAT

## 2020-07-17 ENCOUNTER — Telehealth: Payer: 59 | Admitting: Internal Medicine

## 2020-07-26 ENCOUNTER — Other Ambulatory Visit: Payer: Self-pay

## 2020-07-26 ENCOUNTER — Ambulatory Visit
Admission: EM | Admit: 2020-07-26 | Discharge: 2020-07-26 | Disposition: A | Discharge status: Home / Self Care | Payer: 59 | Attending: Emergency Medicine | Admitting: Emergency Medicine

## 2020-07-26 DIAGNOSIS — Z1152 Encounter for screening for COVID-19: Secondary | ICD-10-CM

## 2020-07-26 DIAGNOSIS — J209 Acute bronchitis, unspecified: Secondary | ICD-10-CM | POA: Diagnosis not present

## 2020-07-26 MED ORDER — ALBUTEROL SULFATE HFA 108 (90 BASE) MCG/ACT IN AERS: 1.0000 | INHALATION_SPRAY | Freq: Four times a day (QID) | RESPIRATORY_TRACT | 0 refills | Status: DC | PRN

## 2020-07-26 MED ORDER — PREDNISONE 50 MG PO TABS
50.0000 mg | ORAL_TABLET | Freq: Every day | ORAL | 0 refills | Status: DC
Start: 2020-07-26 — End: 2021-08-09

## 2020-07-26 MED ORDER — MONTELUKAST SODIUM 10 MG PO TABS: 10.0000 mg | ORAL_TABLET | Freq: Every day | ORAL | 0 refills | Status: DC

## 2020-07-26 MED ORDER — NEBULIZER DEVI: 1.0000 | 0 refills | Status: AC | PRN

## 2020-07-26 MED ORDER — ALBUTEROL SULFATE (2.5 MG/3ML) 0.083% IN NEBU
2.5000 mg | INHALATION_SOLUTION | Freq: Four times a day (QID) | RESPIRATORY_TRACT | 0 refills | Status: DC | PRN
Start: 2020-07-26 — End: 2021-06-06

## 2020-07-26 MED ORDER — CETIRIZINE HCL 10 MG PO TABS: 10.0000 mg | ORAL_TABLET | Freq: Every day | ORAL | 0 refills | Status: DC

## 2020-07-26 NOTE — ED Provider Notes (Signed)
[facility]    CSN: [id number] Arrival date & time: 07/26/20  1644      History   Chief Complaint Chief Complaint  Patient presents with  . Asthma    HPI Elaine James is a 44 y.o. female  Presenting for persistent cough and chest tightness.  Video translator services used.  Patient endorsing URI symptoms since last week including sinus pressure, headache, fatigue, nasal congestion and cough.  States she has been using her home nebulizer 3 times daily with relief.  Has not undergone Covid testing: Requesting to do so today.  Does endorse mildly productive cough with white sputum production.  No difficulty breathing, chest pain or palpitations, nausea, vomiting.  Past Medical History:  Diagnosis Date  . Asthma   . Hypertension     There are no problems to display for this patient.   Past Surgical History:  Procedure Laterality Date  . APPENDECTOMY    . TUBAL LIGATION      OB History    Gravida  2   Para  2   Term  2   Preterm      AB      Living  2     SAB      TAB      Ectopic      Multiple      Live Births               Home Medications    Prior to Admission medications   Medication Sig Start Date End Date Taking? Authorizing Provider  albuterol (PROVENTIL) (2.5 MG/3ML) 0.083% nebulizer solution Take 3 mLs (2.5 mg total) by nebulization every 6 (six) hours as needed for wheezing or shortness of breath. 07/26/20   Hall-Potvin, [place], PA-C  albuterol (VENTOLIN HFA) 108 (90 Base) MCG/ACT inhaler Inhale 1-2 puffs into the lungs every 6 (six) hours as needed for wheezing or shortness of breath. 07/26/20   Hall-Potvin, [place], PA-C  budesonide-formoterol (SYMBICORT) 80-4.5 MCG/ACT inhaler Inhale 2 puffs into the lungs in the morning and at bedtime. 04/14/20   Hall-Potvin, [place], PA-C  cetirizine (ZYRTEC) 10 MG tablet Take 1 tablet (10 mg total) by mouth daily. 07/26/20   Hall-Potvin, [place], PA-C  Ferrous Sulfate  (IRON) 325 (65 Fe) MG TABS Take 1 tablet by mouth daily. 09/10/19   [provider]  lisinopril-hydrochlorothiazide (PRINZIDE,ZESTORETIC) 10-12.5 MG per tablet Take 1 tablet by mouth daily.    [provider]  montelukast (SINGULAIR) 10 MG tablet Take 1 tablet (10 mg total) by mouth at bedtime. 07/26/20   Hall-Potvin, [place], PA-C  Multiple Vitamin (MULTIVITAMIN WITH MINERALS) TABS tablet Take 1 tablet by mouth daily.    [provider]  predniSONE (DELTASONE) 50 MG tablet Take 1 tablet (50 mg total) by mouth daily with breakfast. 07/26/20   Hall-Potvin, [place], PA-C  Respiratory Therapy Supplies (NEBULIZER) DEVI 1 Device by Does not apply route every 4 (four) hours as needed. 07/26/20   Hall-Potvin, [place], PA-C  beclomethasone (QVAR) 80 MCG/ACT inhaler Inhale into the lungs. 08/17/19 11/12/19  [provider]  simvastatin (ZOCOR) 20 MG tablet Take by mouth. 08/17/19 11/12/19  [provider]    Family History Family History  Problem Relation Age of Onset  . Healthy Mother   . Healthy Father     Social History Social History   Tobacco Use  . Smoking status: Never Smoker  . Smokeless tobacco: Never Used  Vaping Use  . Vaping Use: Never used  Substance Use Topics  . Alcohol use: No  . Drug use: No     Allergies   Patient has no known allergies.   Review of Systems As per HPI   Physical Exam Triage Vital Signs ED Triage Vitals  Enc Vitals Group     BP 07/26/20 1848 (!) 164/113     Pulse Rate 07/26/20 1848 80     Resp 07/26/20 1848 18     Temp 07/26/20 1848 98.2 F (36.8 C)     Temp Source 07/26/20 1848 Oral     SpO2 07/26/20 1848 98 %     Weight --      Height --      Head Circumference --      Peak Flow --      Pain Score 07/26/20 1850 5     Pain Loc --      Pain Edu? --      Excl. in GC? --    No data found.  Updated Vital Signs BP (!) 164/113 (BP Location: Left Arm)   Pulse 80   Temp 98.2 F (36.8 C) (Oral)    Resp 18   LMP 07/09/2020   SpO2 98%   Visual Acuity Right Eye Distance:   Left Eye Distance:   Bilateral Distance:    Right Eye Near:   Left Eye Near:    Bilateral Near:     Physical Exam Constitutional:      General: She is not in acute distress.    Appearance: She is obese. She is not ill-appearing or diaphoretic.  HENT:     Head: Normocephalic and atraumatic.     Mouth/Throat:     Mouth: Mucous membranes are moist.     Pharynx: Oropharynx is clear. No oropharyngeal exudate or posterior oropharyngeal erythema.  Eyes:     General: No scleral icterus.    Conjunctiva/sclera: Conjunctivae normal.     Pupils: Pupils are equal, round, and reactive to light.  Neck:     Comments: Trachea midline, negative JVD Cardiovascular:     Rate and Rhythm: Normal rate and regular rhythm.     Heart sounds: No murmur heard.  No gallop.   Pulmonary:     Effort: Pulmonary effort is normal. No respiratory distress.     Breath sounds: No wheezing, rhonchi or rales.  Musculoskeletal:     Cervical back: Neck supple. No tenderness.  Lymphadenopathy:     Cervical: No cervical adenopathy.  Skin:    Capillary Refill: Capillary refill takes less than 2 seconds.     Coloration: Skin is not jaundiced or pale.     Findings: No rash.  Neurological:     General: No focal deficit present.     Mental Status: She is alert and oriented to person, place, and time.      UC Treatments / Results  Labs (all labs ordered are listed, but only abnormal results are displayed) Labs Reviewed  NOVEL CORONAVIRUS, NAA    EKG   Radiology No results found.  Procedures Procedures (including critical care time)  Medications Ordered in UC Medications - No data to display  Initial Impression / Assessment and Plan / UC Course  I have reviewed the triage vital signs and the nursing notes.  Pertinent labs & imaging results that were available during my care of the patient were reviewed by me and  considered in my medical decision making (see chart for details).     Patient afebrile, nontoxic, with SpO2  98%.  Covid PCR pending.  Patient to quarantine until results are back.  We will treat supportively as outlined below for suspected bronchitis.  Return precautions discussed, patient verbalized understanding and is agreeable to plan. Final Clinical Impressions(s) / UC Diagnoses   Final diagnoses:  Encounter for screening for COVID-19  Acute bronchitis, unspecified organism     Discharge Instructions     Your COVID test is pending - it is important to quarantine / isolate at home until your results are back. If you test positive and would like further evaluation for persistent or worsening symptoms, you may schedule an E-visit or virtual (video) visit throughout the [facility] app or website.  PLEASE NOTE: If you develop severe chest pain or shortness of breath please go to the ER or call 9-1-1 for further evaluation --> DO NOT schedule electronic or virtual visits for this. Please call our office for further guidance / recommendations as needed.  For information about the Covid vaccine, please visit [email or web address]    ED Prescriptions    Medication Sig Dispense Auth. Provider   albuterol (PROVENTIL) (2.5 MG/3ML) 0.083% nebulizer solution Take 3 mLs (2.5 mg total) by nebulization every 6 (six) hours as needed for wheezing or shortness of breath. 75 mL Hall-Potvin, [place], PA-C   albuterol (VENTOLIN HFA) 108 (90 Base) MCG/ACT inhaler Inhale 1-2 puffs into the lungs every 6 (six) hours as needed for wheezing or shortness of breath. 18 g Hall-Potvin, [place], PA-C   predniSONE (DELTASONE) 50 MG tablet Take 1 tablet (50 mg total) by mouth daily with breakfast. 5 tablet Hall-Potvin, [place], PA-C   cetirizine (ZYRTEC) 10 MG tablet Take 1 tablet (10 mg total) by mouth daily. 30 tablet Hall-Potvin, [place], PA-C   montelukast (SINGULAIR) 10 MG tablet Take 1 tablet  (10 mg total) by mouth at bedtime. 30 tablet Hall-Potvin, [place], PA-C   Respiratory Therapy Supplies (NEBULIZER) DEVI 1 Device by Does not apply route every 4 (four) hours as needed. 1 each Hall-Potvin, [place], PA-C     PDMP not reviewed this encounter.   Odette Fraction [place], [place] 07/26/20 1928

## 2020-07-26 NOTE — ED Triage Notes (Signed)
Pt states had sinus pressure last week with headache, fatigue, nasal congestion, and asthma exacerbation. States using her neb tx's x3 days. No distress noted. Denies SOB.

## 2020-07-26 NOTE — Discharge Instructions (Signed)
Your COVID test is pending - it is important to quarantine / isolate at home until your results are back. °If you test positive and would like further evaluation for persistent or worsening symptoms, you may schedule an E-visit or virtual (video) visit throughout the [facility] MyChart app or website. ° °PLEASE NOTE: If you develop severe chest pain or shortness of breath please go to the ER or call 9-1-1 for further evaluation --> DO NOT schedule electronic or virtual visits for this. °Please call our office for further guidance / recommendations as needed. ° °For information about the Covid vaccine, please visit [email or web address] °

## 2020-07-28 LAB — NOVEL CORONAVIRUS, NAA: SARS-CoV-2, NAA: NOT DETECTED

## 2021-05-01 ENCOUNTER — Emergency Department (HOSPITAL_COMMUNITY)
Admission: EM | Admit: 2021-05-01 | Discharge: 2021-05-02 | Disposition: A | Discharge status: Home / Self Care | Payer: 59 | Attending: Emergency Medicine | Admitting: Emergency Medicine

## 2021-05-01 ENCOUNTER — Other Ambulatory Visit: Payer: Self-pay

## 2021-05-01 ENCOUNTER — Encounter (HOSPITAL_COMMUNITY): Payer: Self-pay

## 2021-05-01 DIAGNOSIS — J45909 Unspecified asthma, uncomplicated: Secondary | ICD-10-CM | POA: Insufficient documentation

## 2021-05-01 DIAGNOSIS — I10 Essential (primary) hypertension: Secondary | ICD-10-CM | POA: Diagnosis not present

## 2021-05-01 DIAGNOSIS — Z9851 Tubal ligation status: Secondary | ICD-10-CM | POA: Insufficient documentation

## 2021-05-01 DIAGNOSIS — N3001 Acute cystitis with hematuria: Secondary | ICD-10-CM | POA: Insufficient documentation

## 2021-05-01 DIAGNOSIS — D72829 Elevated white blood cell count, unspecified: Secondary | ICD-10-CM | POA: Insufficient documentation

## 2021-05-01 DIAGNOSIS — R319 Hematuria, unspecified: Secondary | ICD-10-CM | POA: Diagnosis present

## 2021-05-01 LAB — URINALYSIS, ROUTINE W REFLEX MICROSCOPIC
Bilirubin Urine: NEGATIVE
Glucose, UA: NEGATIVE mg/dL
Ketones, ur: NEGATIVE mg/dL
Nitrite: POSITIVE — AB
Protein, ur: 100 mg/dL — AB
RBC / HPF: >50 RBC/hpf — ABNORMAL HIGH (ref 0–5)
Specific Gravity, Urine: 1.017 (ref 1.005–1.030)
WBC, UA: >50 WBC/hpf — ABNORMAL HIGH (ref 0–5)
pH: 6 (ref 5.0–8.0)

## 2021-05-01 LAB — I-STAT BETA HCG BLOOD, ED (MC, WL, AP ONLY): I-stat hCG, quantitative: <5 m[IU]/mL (ref <5)

## 2021-05-01 NOTE — ED Triage Notes (Signed)
Patient complains of dysuria since Sunday. Complains of pain worse after urination and hematuria. denies abdominal pain. Reports frequency with same. Patient denies nausea, vomiting

## 2021-05-01 NOTE — ED Provider Notes (Signed)
Emergency Medicine Provider Triage Evaluation Note  Elaine James , a 45 y.o. female  was evaluated in triage.  Pt complains of dysuria, frequency, urgency, and hematuria.  Review of Systems  Positive: Dysuria, frequency, hematuria Negative: Fever, nv  Physical Exam  BP (!) 180/128   Pulse (!) 113   Temp 98.9 F (37.2 C) (Oral)   Resp 18   SpO2 98%  Gen:   Awake, no distress   Resp:  Normal effort  MSK:   Moves extremities without difficulty  Other:  No abd ttp  Medical Decision Making  Medically screening exam initiated at 6:08 PM.  Appropriate orders placed.  Elaine James was informed that the remainder of the evaluation will be completed by another provider, this initial triage assessment does not replace that evaluation, and the importance of remaining in the ED until their evaluation is complete.     Karrie Meres, PA-C 05/01/21 1829    Pricilla Loveless, MD 05/01/21 2303

## 2021-05-02 MED ORDER — CEPHALEXIN 500 MG PO CAPS
500.0000 mg | ORAL_CAPSULE | Freq: Two times a day (BID) | ORAL | 0 refills | Status: AC
Start: 2021-05-02 — End: 2021-05-09

## 2021-05-02 MED ORDER — SODIUM CHLORIDE 0.9 % IV SOLN
1.0000 g | Freq: Once | INTRAVENOUS | Status: AC
  Administered 2021-05-02: 1 g via INTRAVENOUS
  Filled 2021-05-02: qty 10

## 2021-05-02 MED ORDER — LISINOPRIL-HYDROCHLOROTHIAZIDE 10-12.5 MG PO TABS: 1.0000 | ORAL_TABLET | Freq: Every day | ORAL | 0 refills | Status: DC

## 2021-05-02 NOTE — ED Provider Notes (Signed)
Emergency Department Provider Note   I have reviewed the triage vital signs and the nursing notes.   HISTORY  Chief Complaint No chief complaint on file.  Video Spanish interpreter used for entire encounter including HPI, ROS, exam, discussion of care plan.   HPI Elaine James is a 45 y.o. female with past medical history of asthma and hypertension presents to the emergency department with 4 days of burning pain with urination and some urine hesitancy.  Patient denies any abdominal pain when not urinating.  She denies any back or flank pain.  No fevers or shaking chills.  She has been taking AZO with no relief in symptoms. Pain is moderate. No radiation of symptoms or modifying factors.   Past Medical History:  Diagnosis Date  . Asthma   . Hypertension     There are no problems to display for this patient.   Past Surgical History:  Procedure Laterality Date  . APPENDECTOMY    . TUBAL LIGATION      Allergies Patient has no known allergies.  Family History  Problem Relation Age of Onset  . Healthy Mother   . Healthy Father     Social History Social History   Tobacco Use  . Smoking status: Never Smoker  . Smokeless tobacco: Never Used  Vaping Use  . Vaping Use: Never used  Substance Use Topics  . Alcohol use: No  . Drug use: No    Review of Systems  Constitutional: No fever/chills Eyes: No visual changes. ENT: No sore throat. Cardiovascular: Denies chest pain. Respiratory: Denies shortness of breath. Gastrointestinal: No abdominal pain.  No nausea, no vomiting.  No diarrhea.  No constipation. Genitourinary: Positive for dysuria and hesitancy.  Musculoskeletal: Negative for back pain. Skin: Negative for rash. Neurological: Negative for focal weakness or numbness. Positive mild HA.   10-point ROS otherwise negative.  ____________________________________________   PHYSICAL EXAM:  VITAL SIGNS: ED Triage Vitals  Enc Vitals Group      BP 05/01/21 1803 (!) 180/128     Pulse Rate 05/01/21 1803 (!) 113     Resp 05/01/21 1803 18     Temp 05/01/21 1803 98.9 F (37.2 C)     Temp Source 05/01/21 1803 Oral     SpO2 05/01/21 1803 98 %   Constitutional: Alert and oriented. Well appearing and in no acute distress. Eyes: Conjunctivae are normal. Head: Atraumatic. Nose: No congestion/rhinnorhea. Mouth/Throat: Mucous membranes are moist.   Neck: No stridor. Cardiovascular: Tachycardia. Good peripheral circulation. Grossly normal heart sounds.   Respiratory: Normal respiratory effort.  No retractions. Lungs CTAB. Gastrointestinal: Soft and nontender. No distention. No CVA tenderness.  Musculoskeletal: No gross deformities of extremities. Neurologic:  Normal speech and language.  Skin:  Skin is warm, dry and intact. No rash noted.  ____________________________________________   LABS (all labs ordered are listed, but only abnormal results are displayed)  Labs Reviewed  URINALYSIS, ROUTINE W REFLEX MICROSCOPIC - Abnormal; Notable for the following components:      Result Value   Color, Urine AMBER (*)    APPearance CLOUDY (*)    Hgb urine dipstick LARGE (*)    Protein, ur 100 (*)    Nitrite POSITIVE (*)    Leukocytes,Ua MODERATE (*)    RBC / HPF >50 (*)    WBC, UA >50 (*)    Bacteria, UA MANY (*)    All other components within normal limits  I-STAT BETA HCG BLOOD, ED ([facility], [facility], [facility] ONLY)  ____________________________________________   PROCEDURES  Procedure(s) performed:   Procedures  None  ____________________________________________   INITIAL IMPRESSION / ASSESSMENT AND PLAN / ED COURSE  Pertinent labs & imaging results that were available during my care of the patient were reviewed by me and considered in my medical decision making (see chart for details).   Patient presents emergency department with dysuria and hesitancy.  Her UA here shows negative pregnancy test and evidence of a urinary tract  infection.  The urine is cloudy with hemoglobin as well as nitrite, leukocytes, many bacteria.  She has no CVA tenderness or fever/chills to suspect pyelonephritis.  Her symptoms are relatively severe, however.  Plan for IV Rocephin prior to discharge and will discharge home with Keflex.  Plan to send the urine for culture.  Considered the possibility of ureteral stone but she is not having back/flank pain or abdominal discomfort.  Most of her symptoms are with urination.  No prior history of kidney stones.  Do not feel she requires advanced abdominal imaging in the setting.   ____________________________________________  FINAL CLINICAL IMPRESSION(S) / ED DIAGNOSES  Final diagnoses:  Acute cystitis with hematuria     MEDICATIONS GIVEN DURING THIS VISIT:  Medications  cefTRIAXone (ROCEPHIN) 1 g in sodium chloride 0.9 % 100 mL IVPB (has no administration in time range)     NEW OUTPATIENT MEDICATIONS STARTED DURING THIS VISIT:  New Prescriptions   CEPHALEXIN (KEFLEX) 500 MG CAPSULE    Take 1 capsule (500 mg total) by mouth 2 (two) times daily for 7 days.    Note:  This document was prepared using Dragon voice recognition software and may include unintentional dictation errors.  Alona Bene, MD, [facility] Emergency Medicine    Shunda Rabadi, Arlyss Repress, MD 05/02/21 [phone]

## 2021-05-05 LAB — URINE CULTURE: Culture: >=100000 — AB

## 2021-05-06 ENCOUNTER — Telehealth: Payer: Self-pay | Admitting: *Deleted

## 2021-05-06 NOTE — Telephone Encounter (Signed)
Post ED Visit - Positive Culture Follow-up  Culture report reviewed by antimicrobial stewardship pharmacist: [facility] []  , Pharm.D. []  Enzo Bi, Pharm.D., BCPS AQ-ID []  , Pharm.D., BCPS []  Celedonio Miyamoto, Pharm.D., BCPS []  [place], Garvin Fila.D., BCPS, AAHIVP []  , Pharm.D., BCPS, AAHIVP []  Georgina Pillion, PharmD, BCPS []  , PharmD, BCPS []  [place], PharmD, BCPS []  [place], PharmD []  , PharmD, BCPS []  Estella Husk, PharmD  Pharmacy Team []  Lysle Pearl, PharmD []  , PharmD []  Phillips Climes, PharmD []  , Rph []  Agapito Games) , PharmD []  Verlan Friends, PharmD []  , PharmD []  Mervyn Gay, PharmD []  , PharmD []  Vinnie Level, PharmD []  [facility], PharmD []  , PharmD []  Len Childs, PharmD   Positive urine culture Treated with Cephalexin, organism sensitive to the same and no further patient follow-up is required at this time.  , PharmD  Greer Pickerel [place]ley 05/06/2021, 10:46 AM

## 2021-06-06 ENCOUNTER — Ambulatory Visit: Payer: 59 | Admitting: Physician Assistant

## 2021-06-06 ENCOUNTER — Encounter: Payer: Self-pay | Admitting: Physician Assistant

## 2021-06-06 ENCOUNTER — Ambulatory Visit (INDEPENDENT_AMBULATORY_CARE_PROVIDER_SITE_OTHER): Payer: 59 | Admitting: Physician Assistant

## 2021-06-06 ENCOUNTER — Other Ambulatory Visit: Payer: Self-pay

## 2021-06-06 VITALS — BP 112/77 | HR 92 | Temp 97.9°F | Resp 17 | Ht 61.58 in | Wt 198.8 lb

## 2021-06-06 DIAGNOSIS — T7840XD Allergy, unspecified, subsequent encounter: Secondary | ICD-10-CM

## 2021-06-06 DIAGNOSIS — D649 Anemia, unspecified: Secondary | ICD-10-CM

## 2021-06-06 DIAGNOSIS — J4520 Mild intermittent asthma, uncomplicated: Secondary | ICD-10-CM | POA: Diagnosis not present

## 2021-06-06 DIAGNOSIS — N390 Urinary tract infection, site not specified: Secondary | ICD-10-CM | POA: Diagnosis not present

## 2021-06-06 DIAGNOSIS — I10 Essential (primary) hypertension: Secondary | ICD-10-CM

## 2021-06-06 LAB — POCT URINALYSIS DIP (CLINITEK)
Bilirubin, UA: NEGATIVE
Blood, UA: NEGATIVE
Glucose, UA: NEGATIVE mg/dL
Ketones, POC UA: NEGATIVE mg/dL
Leukocytes, UA: NEGATIVE
Nitrite, UA: NEGATIVE
POC PROTEIN,UA: NEGATIVE
Spec Grav, UA: 1.02 (ref 1.010–1.025)
Urobilinogen, UA: 0.2 E.U./dL
pH, UA: 6 (ref 5.0–8.0)

## 2021-06-06 MED ORDER — CETIRIZINE HCL 10 MG PO TABS
10.0000 mg | ORAL_TABLET | Freq: Every day | ORAL | 11 refills | Status: DC
Start: 2021-06-06 — End: 2021-08-09

## 2021-06-06 MED ORDER — MONTELUKAST SODIUM 10 MG PO TABS: 10.0000 mg | ORAL_TABLET | Freq: Every day | ORAL | 3 refills | Status: DC

## 2021-06-06 MED ORDER — IRON 325 (65 FE) MG PO TABS: 1.0000 | ORAL_TABLET | Freq: Every day | ORAL | 3 refills | Status: DC

## 2021-06-06 MED ORDER — LISINOPRIL-HYDROCHLOROTHIAZIDE 10-12.5 MG PO TABS: 1.0000 | ORAL_TABLET | Freq: Every day | ORAL | 1 refills | Status: DC

## 2021-06-06 MED ORDER — ALBUTEROL SULFATE (2.5 MG/3ML) 0.083% IN NEBU: 2.5000 mg | INHALATION_SOLUTION | Freq: Four times a day (QID) | RESPIRATORY_TRACT | 0 refills | Status: DC | PRN

## 2021-06-06 MED ORDER — BUDESONIDE-FORMOTEROL FUMARATE 80-4.5 MCG/ACT IN AERO: 2.0000 | INHALATION_SPRAY | Freq: Two times a day (BID) | RESPIRATORY_TRACT | 0 refills | Status: DC

## 2021-06-06 MED ORDER — ALBUTEROL SULFATE HFA 108 (90 BASE) MCG/ACT IN AERS: 1.0000 | INHALATION_SPRAY | Freq: Four times a day (QID) | RESPIRATORY_TRACT | 0 refills | Status: DC | PRN

## 2021-06-06 NOTE — Progress Notes (Signed)
Patient ID: Elaine James Drafts, female   DOB: Mar 05, 1976, 45 y.o.   MRN: [id number]     Elaine James, is a 45 y.o. female  DGU:[id number]  ZDG:[id number]  DOB - 1976-09-28  Subjective:  Chief Complaint and HPI: Elaine James is a 45 y.o. female here today to establish care and for a follow up visit After being seen at the ED for UTI 05/02/2021.  Culture showed E.coli.  she was given rocephin and keflex.  UTI symptoms have resolved.  Recently got insurance and wants to get restarted on all her meds.  No new issues or concerns.  From ED A/P: Patient presents emergency department with dysuria and hesitancy.  Her UA here shows negative pregnancy test and evidence of a urinary tract infection.  The urine is cloudy with hemoglobin as well as nitrite, leukocytes, many bacteria.  She has no CVA tenderness or fever/chills to suspect pyelonephritis.  Her symptoms are relatively severe, however.  Plan for IV Rocephin prior to discharge and will discharge home with Keflex.  Plan to send the urine for culture.  Considered the possibility of ureteral stone but she is not having back/flank pain or abdominal discomfort.  Most of her symptoms are with urination.  No prior history of kidney stones.  Do not feel she requires advanced abdominal imaging in the setting  ED/Hospital notes reviewed.    ROS:   Constitutional:  No f/c, No night sweats, No unexplained weight loss. EENT:  No vision changes, No blurry vision, No hearing changes. No mouth, throat, or ear problems.  Respiratory: No cough, No SOB Cardiac: No CP, no palpitations GI:  No abd pain, No N/V/D. GU: No Urinary s/sx Musculoskeletal: No joint pain Neuro: No headache, no dizziness, no motor weakness.  Skin: No rash Endocrine:  No polydipsia. No polyuria.  Psych: Denies SI/HI  No problems updated.  ALLERGIES: No Known Allergies  PAST MEDICAL HISTORY: Past Medical History:  Diagnosis Date   Asthma     Hypertension     MEDICATIONS AT HOME: Prior to Admission medications   Medication Sig Start Date End Date Taking? Authorizing Provider  albuterol (PROVENTIL) (2.5 MG/3ML) 0.083% nebulizer solution Take 3 mLs (2.5 mg total) by nebulization every 6 (six) hours as needed for wheezing or shortness of breath. 06/06/21   Anders Simmonds, PA-C  albuterol (VENTOLIN HFA) 108 (90 Base) MCG/ACT inhaler Inhale 1-2 puffs into the lungs every 6 (six) hours as needed for wheezing or shortness of breath. 06/06/21   Anders Simmonds, PA-C  budesonide-formoterol (SYMBICORT) 80-4.5 MCG/ACT inhaler Inhale 2 puffs into the lungs in the morning and at bedtime. 06/06/21   Anders Simmonds, PA-C  cetirizine (ZYRTEC) 10 MG tablet Take 1 tablet (10 mg total) by mouth daily. 07/26/20   Hall-Potvin, [place], PA-C  Ferrous Sulfate (IRON) 325 (65 Fe) MG TABS Take 1 tablet (325 mg total) by mouth daily. 06/06/21   Anders Simmonds, PA-C  lisinopril-hydrochlorothiazide (ZESTORETIC) 10-12.5 MG tablet Take 1 tablet by mouth daily. 06/06/21 07/06/21  Anders Simmonds, PA-C  montelukast (SINGULAIR) 10 MG tablet Take 1 tablet (10 mg total) by mouth at bedtime. 06/06/21   Anders Simmonds, PA-C  Multiple Vitamin (MULTIVITAMIN WITH MINERALS) TABS tablet Take 1 tablet by mouth daily.    [provider]  predniSONE (DELTASONE) 50 MG tablet Take 1 tablet (50 mg total) by mouth daily with breakfast. 07/26/20   Hall-Potvin, [place], PA-C  Respiratory Therapy Supplies (NEBULIZER) DEVI 1 Device  by Does not apply route every 4 (four) hours as needed. 07/26/20   Hall-Potvin, [place], PA-C  beclomethasone (QVAR) 80 MCG/ACT inhaler Inhale into the lungs. 08/17/19 11/12/19  [provider]  simvastatin (ZOCOR) 20 MG tablet Take by mouth. 08/17/19 11/12/19  [provider]     Objective:  EXAM:   Vitals:   06/06/21 1408  BP: 112/77  Pulse: 92  Resp: 17  Temp: 97.9 F (36.6 C)  SpO2: 99%  Weight: 198 lb 12.8 oz  (90.2 kg)  Height: 5' 1.58" (1.564 m)    General appearance : A&OX3. NAD. Non-toxic-appearing HEENT: Atraumatic and Normocephalic.  PERRLA. EOM intact.   Chest/Lungs:  Breathing-non-labored, Good air entry bilaterally, breath sounds normal without rales, rhonchi, or wheezing  CVS: S1 S2 regular, no murmurs, gallops, rubs  Extremities: Bilateral Lower Ext shows no edema, both legs are warm to touch with = pulse throughout Neurology:  CN II-XII grossly intact, Non focal.   Psych:  TP linear. J/I WNL. Normal speech. Appropriate eye contact and affect.  Skin:  No Rash  Data Review No results found for: HGBA1C   Assessment & Plan   1. Urinary tract infection without hematuria, site unspecified - POCT URINALYSIS DIP (CLINITEK)  2. Mild intermittent asthma, unspecified whether complicated - albuterol (VENTOLIN HFA) 108 (90 Base) MCG/ACT inhaler; Inhale 1-2 puffs into the lungs every 6 (six) hours as needed for wheezing or shortness of breath.  Dispense: 18 g; Refill: 0 - montelukast (SINGULAIR) 10 MG tablet; Take 1 tablet (10 mg total) by mouth at bedtime.  Dispense: 30 tablet; Refill: 3 - albuterol (PROVENTIL) (2.5 MG/3ML) 0.083% nebulizer solution; Take 3 mLs (2.5 mg total) by nebulization every 6 (six) hours as needed for wheezing or shortness of breath.  Dispense: 75 mL; Refill: 0  3. Allergy, subsequent encounter -zyrtec sent - albuterol (VENTOLIN HFA) 108 (90 Base) MCG/ACT inhaler; Inhale 1-2 puffs into the lungs every 6 (six) hours as needed for wheezing or shortness of breath.  Dispense: 18 g; Refill: 0 - budesonide-formoterol (SYMBICORT) 80-4.5 MCG/ACT inhaler; Inhale 2 puffs into the lungs in the morning and at bedtime.  Dispense: 1 each; Refill: 0 - montelukast (SINGULAIR) 10 MG tablet; Take 1 tablet (10 mg total) by mouth at bedtime.  Dispense: 30 tablet; Refill: 3  4. Anemia, unspecified type - CBC with Differential/Platelet - Ferrous Sulfate (IRON) 325 (65 Fe) MG TABS; Take  1 tablet (325 mg total) by mouth daily.  Dispense: 30 tablet; Refill: 3  5. Hypertension, unspecified type controlled - Comprehensive metabolic panel - CBC with Differential/Platelet - lisinopril-hydrochlorothiazide (ZESTORETIC) 10-12.5 MG tablet; Take 1 tablet by mouth daily.  Dispense: 90 tablet; Refill: 1     Patient have been counseled extensively about nutrition and exercise  Return in about 3 months (around 09/06/2021) for asign PCP.  The patient was given clear instructions to go to ER or return to medical center if symptoms don't improve, worsen or new problems develop. The patient verbalized understanding. The patient was told to call to get lab results if they haven't heard anything in the next week.     Georgian Co, PA-C [facility] [place], [place] [phone]   06/06/2021, 3:04 PM

## 2021-06-06 NOTE — Progress Notes (Signed)
Pt presents for follow up of E.coli present in urine

## 2021-06-07 LAB — COMPREHENSIVE METABOLIC PANEL
ALT: 15 IU/L (ref 0–32)
AST: 14 IU/L (ref 0–40)
Albumin/Globulin Ratio: 1.8 (ref 1.2–2.2)
Albumin: 4.7 g/dL (ref 3.8–4.8)
Alkaline Phosphatase: 83 IU/L (ref 44–121)
BUN/Creatinine Ratio: 18 (ref 9–23)
BUN: 14 mg/dL (ref 6–24)
Bilirubin Total: 0.2 mg/dL (ref 0.0–1.2)
CO2: 19 mmol/L — ABNORMAL LOW (ref 20–29)
Calcium: 9.6 mg/dL (ref 8.7–10.2)
Chloride: 101 mmol/L (ref 96–106)
Creatinine, Ser: 0.79 mg/dL (ref 0.57–1.00)
Globulin, Total: 2.6 g/dL (ref 1.5–4.5)
Glucose: 72 mg/dL (ref 65–99)
Potassium: 4 mmol/L (ref 3.5–5.2)
Sodium: 136 mmol/L (ref 134–144)
Total Protein: 7.3 g/dL (ref 6.0–8.5)
eGFR: 94 mL/min/{1.73_m2} (ref >59)

## 2021-06-07 LAB — CBC WITH DIFFERENTIAL/PLATELET
Basophils Absolute: 0.1 10*3/uL (ref 0.0–0.2)
Basos: 1 %
EOS (ABSOLUTE): 0.2 10*3/uL (ref 0.0–0.4)
Eos: 3 %
Hematocrit: 43.1 % (ref 34.0–46.6)
Hemoglobin: 14.4 g/dL (ref 11.1–15.9)
Immature Grans (Abs): 0 10*3/uL (ref 0.0–0.1)
Immature Granulocytes: 0 %
Lymphocytes Absolute: 2.3 10*3/uL (ref 0.7–3.1)
Lymphs: 27 %
MCH: 30.1 pg (ref 26.6–33.0)
MCHC: 33.4 g/dL (ref 31.5–35.7)
MCV: 90 fL (ref 79–97)
Monocytes Absolute: 0.7 10*3/uL (ref 0.1–0.9)
Monocytes: 8 %
Neutrophils Absolute: 5.3 10*3/uL (ref 1.4–7.0)
Neutrophils: 61 %
Platelets: 336 10*3/uL (ref 150–450)
RBC: 4.79 x10E6/uL (ref 3.77–5.28)
RDW: 12.5 % (ref 11.7–15.4)
WBC: 8.5 10*3/uL (ref 3.4–10.8)

## 2021-06-26 ENCOUNTER — Encounter: Payer: Self-pay | Admitting: *Deleted

## 2021-08-05 IMAGING — DX DG SHOULDER 2+V*R*
4 series · 4 of 4 positions shown · non-contrast
Comparison: None.

CLINICAL DATA: 43-year-old female with a history right shoulder
injury

EXAM:
RIGHT SHOULDER - 2+ VIEW

[shoulder ap]
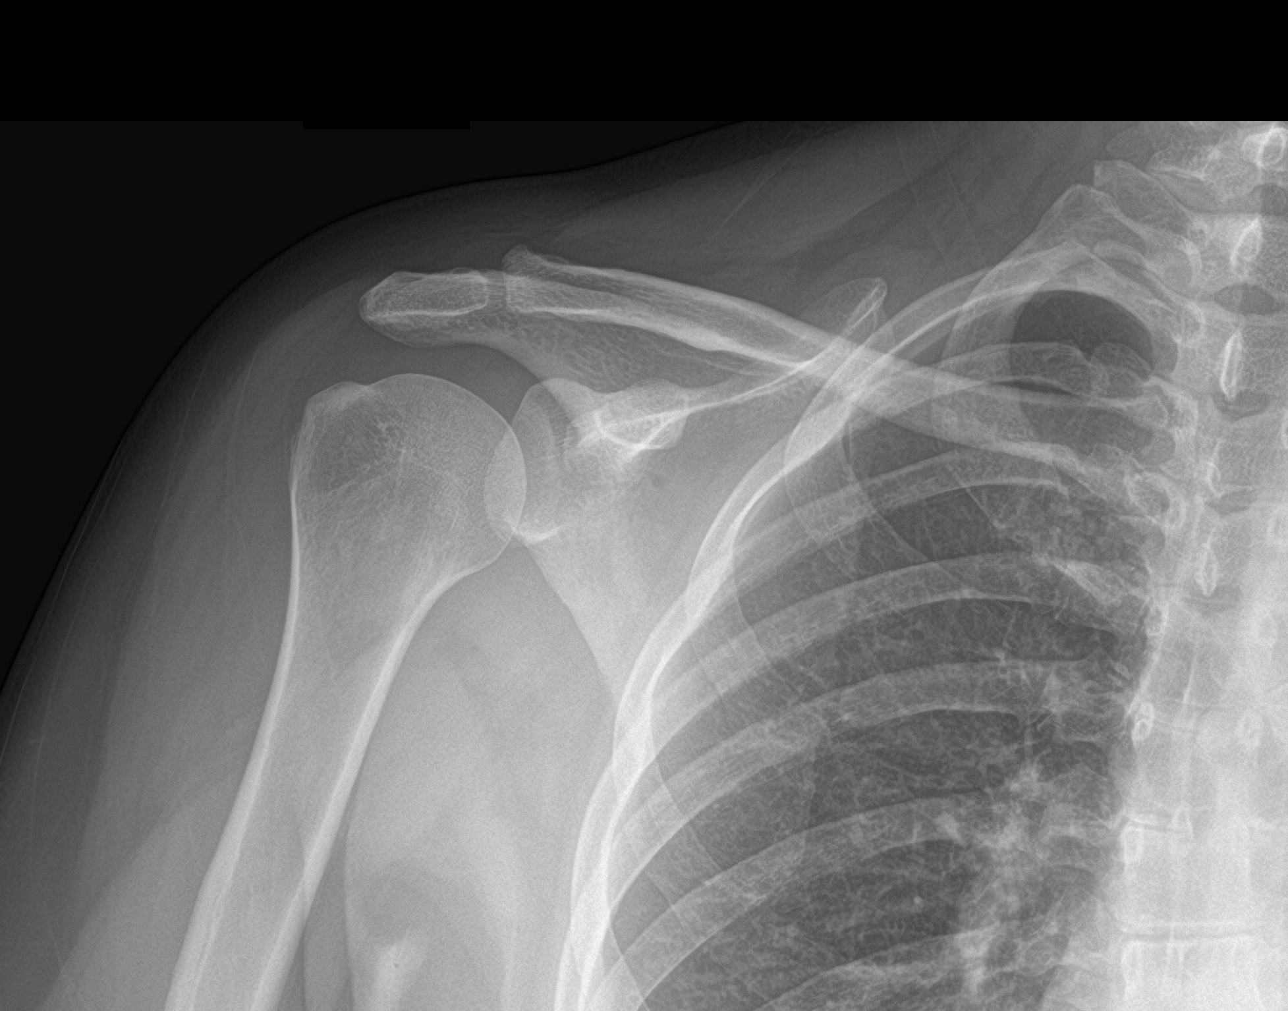

[shoulder grashey]
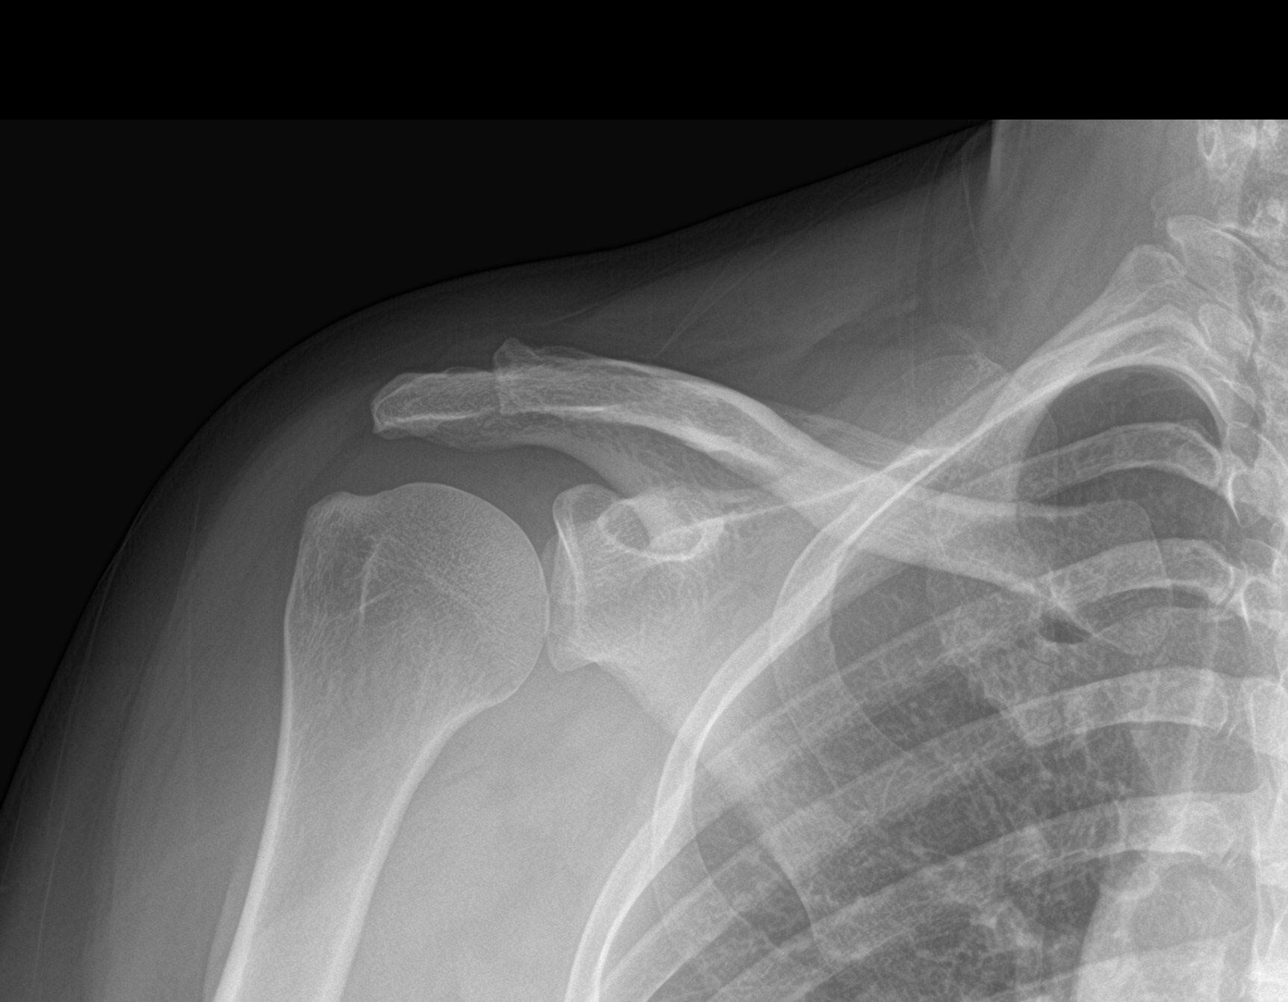

[shoulder y-view]
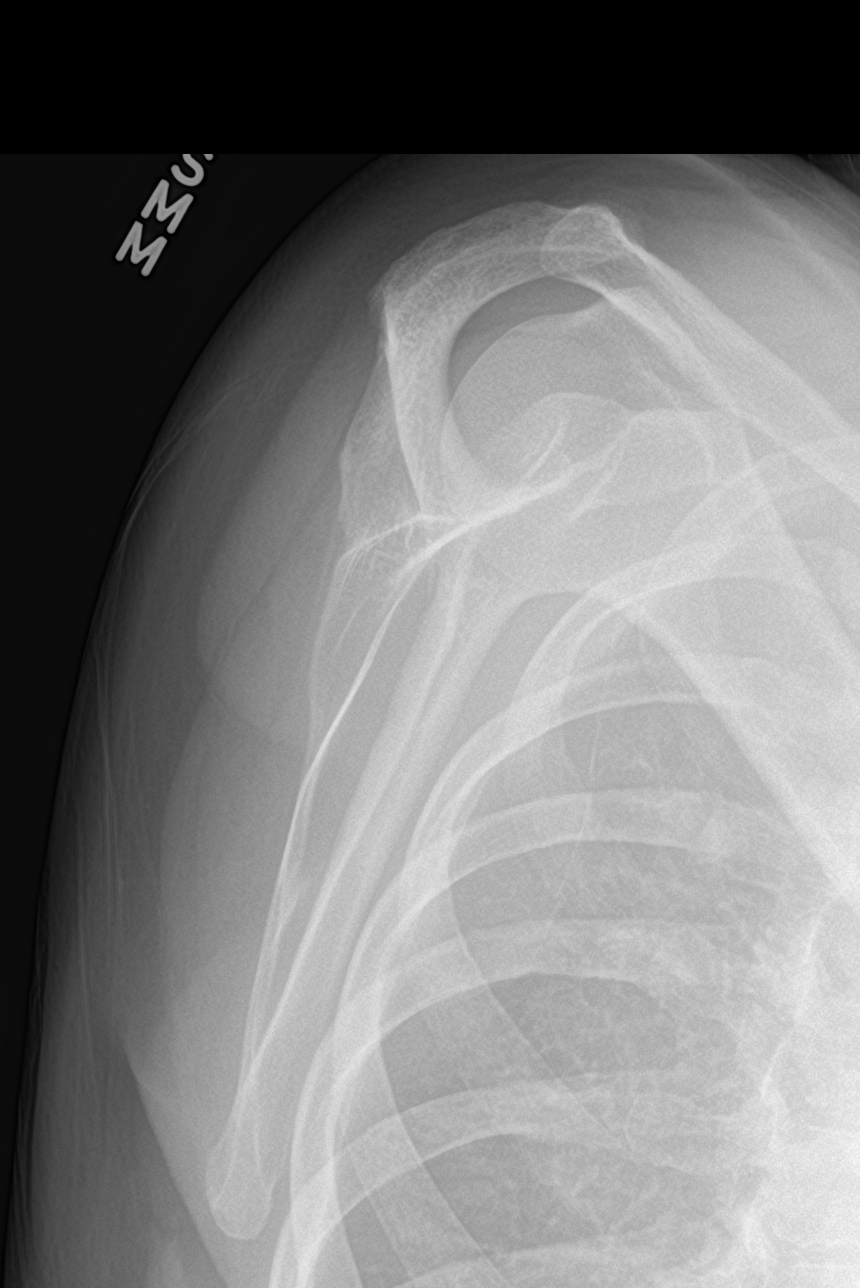

[shoulder axial]
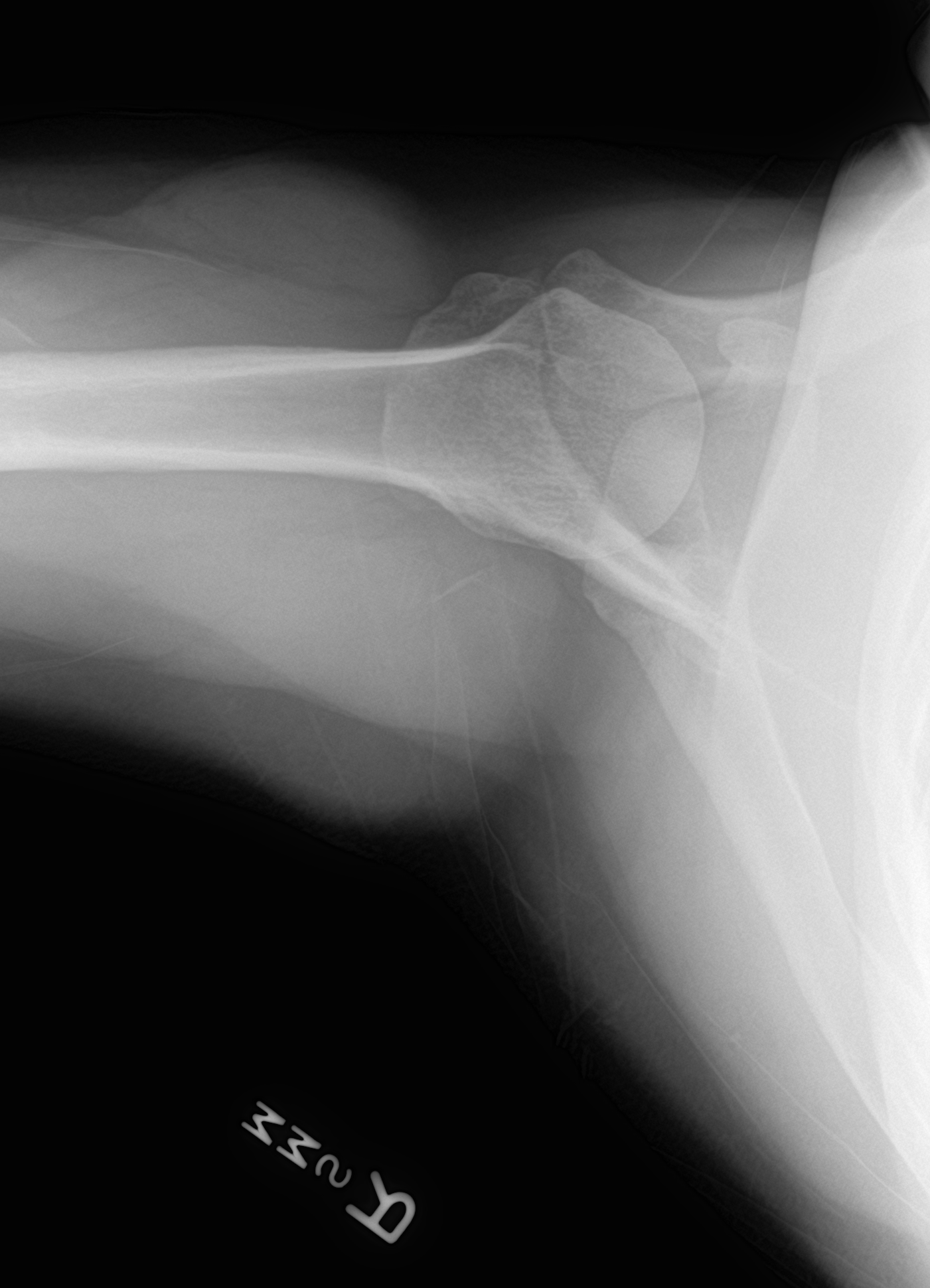

[4 of 4 positions shown; findings below may reference images not displayed]

FINDINGS: There is no evidence of fracture or dislocation. There is no
evidence of arthropathy or other focal bone abnormality. Soft
tissues are unremarkable.
IMPRESSION: Negative.

## 2021-08-09 ENCOUNTER — Encounter: Payer: Self-pay | Admitting: Nurse Practitioner

## 2021-08-09 ENCOUNTER — Other Ambulatory Visit: Payer: Self-pay

## 2021-08-09 ENCOUNTER — Ambulatory Visit: Payer: 59 | Attending: Nurse Practitioner | Admitting: Nurse Practitioner

## 2021-08-09 VITALS — BP 127/80 | HR 71 | Ht 61.0 in | Wt 201.4 lb

## 2021-08-09 DIAGNOSIS — E785 Hyperlipidemia, unspecified: Secondary | ICD-10-CM

## 2021-08-09 DIAGNOSIS — Z1211 Encounter for screening for malignant neoplasm of colon: Secondary | ICD-10-CM

## 2021-08-09 DIAGNOSIS — T7840XD Allergy, unspecified, subsequent encounter: Secondary | ICD-10-CM

## 2021-08-09 DIAGNOSIS — Z114 Encounter for screening for human immunodeficiency virus [HIV]: Secondary | ICD-10-CM

## 2021-08-09 DIAGNOSIS — J45909 Unspecified asthma, uncomplicated: Secondary | ICD-10-CM | POA: Diagnosis not present

## 2021-08-09 DIAGNOSIS — I10 Essential (primary) hypertension: Secondary | ICD-10-CM

## 2021-08-09 DIAGNOSIS — J4520 Mild intermittent asthma, uncomplicated: Secondary | ICD-10-CM

## 2021-08-09 DIAGNOSIS — Z1159 Encounter for screening for other viral diseases: Secondary | ICD-10-CM

## 2021-08-09 DIAGNOSIS — G44229 Chronic tension-type headache, not intractable: Secondary | ICD-10-CM

## 2021-08-09 DIAGNOSIS — D649 Anemia, unspecified: Secondary | ICD-10-CM

## 2021-08-09 DIAGNOSIS — Z7689 Persons encountering health services in other specified circumstances: Secondary | ICD-10-CM

## 2021-08-09 MED ORDER — AMITRIPTYLINE HCL 25 MG PO TABS: 12.5000 mg | ORAL_TABLET | Freq: Every day | ORAL | 3 refills | Status: AC

## 2021-08-09 MED ORDER — IRON 325 (65 FE) MG PO TABS: 1.0000 | ORAL_TABLET | Freq: Every day | ORAL | 3 refills | Status: AC

## 2021-08-09 MED ORDER — ALBUTEROL SULFATE HFA 108 (90 BASE) MCG/ACT IN AERS: 1.0000 | INHALATION_SPRAY | Freq: Four times a day (QID) | RESPIRATORY_TRACT | 0 refills | Status: DC | PRN

## 2021-08-09 MED ORDER — LISINOPRIL-HYDROCHLOROTHIAZIDE 10-12.5 MG PO TABS: 1.0000 | ORAL_TABLET | Freq: Every day | ORAL | 1 refills | Status: AC

## 2021-08-09 MED ORDER — ALBUTEROL SULFATE (2.5 MG/3ML) 0.083% IN NEBU: 2.5000 mg | INHALATION_SOLUTION | Freq: Four times a day (QID) | RESPIRATORY_TRACT | 0 refills | Status: AC | PRN

## 2021-08-09 MED ORDER — CETIRIZINE HCL 10 MG PO TABS
10.0000 mg | ORAL_TABLET | Freq: Every day | ORAL | 11 refills | Status: AC
Start: 2021-08-09 — End: ?

## 2021-08-09 MED ORDER — BUDESONIDE-FORMOTEROL FUMARATE 80-4.5 MCG/ACT IN AERO: 2.0000 | INHALATION_SPRAY | Freq: Two times a day (BID) | RESPIRATORY_TRACT | 99 refills | Status: AC

## 2021-08-09 MED ORDER — MONTELUKAST SODIUM 10 MG PO TABS: 10.0000 mg | ORAL_TABLET | Freq: Every day | ORAL | 3 refills | Status: DC

## 2021-08-09 NOTE — Progress Notes (Signed)
Elaine James [phone]

## 2021-08-09 NOTE — Progress Notes (Signed)
Assessment & Plan:  Elaine James was seen today for establish care.  Diagnoses and all orders for this visit:  Encounter to establish care  Hypertension, unspecified type -     lisinopril-hydrochlorothiazide (ZESTORETIC) 10-12.5 MG tablet; Take 1 tablet by mouth daily. Continue all antihypertensives as prescribed.  Remember to bring in your blood pressure log with you for your follow up appointment.  DASH/Mediterranean Diets are healthier choices for HTN.    Mild intermittent asthma, unspecified whether complicated -     montelukast (SINGULAIR) 10 MG tablet; Take 1 tablet (10 mg total) by mouth at bedtime. -     albuterol (PROVENTIL) (2.5 MG/3ML) 0.083% nebulizer solution; Take 3 mLs (2.5 mg total) by nebulization every 6 (six) hours as needed for wheezing or shortness of breath. -     albuterol (VENTOLIN HFA) 108 (90 Base) MCG/ACT inhaler; Inhale 1-2 puffs into the lungs every 6 (six) hours as needed for wheezing or shortness of breath.  Allergy, subsequent encounter -     montelukast (SINGULAIR) 10 MG tablet; Take 1 tablet (10 mg total) by mouth at bedtime. -     albuterol (VENTOLIN HFA) 108 (90 Base) MCG/ACT inhaler; Inhale 1-2 puffs into the lungs every 6 (six) hours as needed for wheezing or shortness of breath. -     budesonide-formoterol (SYMBICORT) 80-4.5 MCG/ACT inhaler; Inhale 2 puffs into the lungs in the morning and at bedtime. NEEDS [employer] if not covered by insurance -     cetirizine (ZYRTEC) 10 MG tablet; Take 1 tablet (10 mg total) by mouth daily.  Chronic tension-type headache, not intractable -     amitriptyline (ELAVIL) 25 MG tablet; Take 0.5-1 tablets (12.5-25 mg total) by mouth at bedtime.  Anemia, unspecified type -     Ferrous Sulfate (IRON) 325 (65 Fe) MG TABS; Take 1 tablet (325 mg total) by mouth daily.  Dyslipidemia, goal LDL below 100 -     Lipid panel  Need for hepatitis C screening test -     HCV Ab w Reflex to Quant PCR  Encounter for screening for  HIV -     HIV antibody (with reflex)  Colon cancer screening -     Ambulatory referral to Gastroenterology   Patient has been counseled on age-appropriate routine health concerns for screening and prevention. These are reviewed and up-to-date. Referrals have been placed accordingly. Immunizations are up-to-date or declined.    Subjective:   Chief Complaint  Patient presents with   Establish Care   HPI Elaine James 45 y.o. female presents to office today to establish care.  She has a past medical history of Asthma and Hypertension.   VRI was used to communicate directly with patient for the entire encounter including providing detailed patient instructions.    Patient has been counseled on age-appropriate routine health concerns for screening and prevention. These are reviewed and up-to-date. Referrals have been placed accordingly. Immunizations are up-to-date or declined.  MAMMOGRAM: overdue. Referred to [facility] today.   PAP: Overdue will schedule for next visit    Tension Headache Endorses daily right sided Headaches.  Occurring on the  Right side of the head behind the right ear. Aggravating factors: stress Relieving factors: NONE Description: aching Duration: Acetaminophen(not much relief) Denies: blurred vision, N/V, sinus symptoms   HTN Blood pressure readings at home she can not recall but reports them as normal. Denies chest pain, shortness of breath, palpitations, lightheadedness, dizziness, headaches or BLE edema.   BP Readings from Last  3 Encounters:  08/09/21 127/80  06/06/21 112/77  05/02/21 (!) 147/98     Asthma She has been out of her symbicort and it was not covered by her insurance at [facility]. Will try to send to [facility] to see if lower price there.      Review of Systems  Constitutional:  Negative for fever, malaise/fatigue and weight loss.  HENT: Negative.  Negative for nosebleeds.   Eyes: Negative.  Negative for blurred vision, double  vision and photophobia.  Respiratory: Negative.  Negative for cough and shortness of breath.   Cardiovascular: Negative.  Negative for chest pain, palpitations and leg swelling.  Gastrointestinal: Negative.  Negative for heartburn, nausea and vomiting.  Musculoskeletal: Negative.  Negative for myalgias.  Neurological:  Positive for headaches. Negative for dizziness, focal weakness and seizures.  Psychiatric/Behavioral: Negative.  Negative for suicidal ideas.    Past Medical History:  Diagnosis Date   Asthma    Hypertension     Past Surgical History:  Procedure Laterality Date   APPENDECTOMY     TUBAL LIGATION      Family History  Problem Relation Age of Onset   Healthy Mother    Healthy Father     Social History Reviewed with no changes to be made today.   Outpatient Medications Prior to Visit  Medication Sig Dispense Refill   Multiple Vitamin (MULTIVITAMIN WITH MINERALS) TABS tablet Take 1 tablet by mouth daily.     Respiratory Therapy Supplies (NEBULIZER) DEVI 1 Device by Does not apply route every 4 (four) hours as needed. 1 each 0   albuterol (PROVENTIL) (2.5 MG/3ML) 0.083% nebulizer solution Take 3 mLs (2.5 mg total) by nebulization every 6 (six) hours as needed for wheezing or shortness of breath. 75 mL 0   albuterol (VENTOLIN HFA) 108 (90 Base) MCG/ACT inhaler Inhale 1-2 puffs into the lungs every 6 (six) hours as needed for wheezing or shortness of breath. 18 g 0   budesonide-formoterol (SYMBICORT) 80-4.5 MCG/ACT inhaler Inhale 2 puffs into the lungs in the morning and at bedtime. 1 each 0   cetirizine (ZYRTEC) 10 MG tablet Take 1 tablet (10 mg total) by mouth daily. 30 tablet 0   cetirizine (ZYRTEC) 10 MG tablet Take 1 tablet (10 mg total) by mouth daily. 30 tablet 11   Ferrous Sulfate (IRON) 325 (65 Fe) MG TABS Take 1 tablet (325 mg total) by mouth daily. 30 tablet 3   montelukast (SINGULAIR) 10 MG tablet Take 1 tablet (10 mg total) by mouth at bedtime. 30 tablet 3    predniSONE (DELTASONE) 50 MG tablet Take 1 tablet (50 mg total) by mouth daily with breakfast. 5 tablet 0   lisinopril-hydrochlorothiazide (ZESTORETIC) 10-12.5 MG tablet Take 1 tablet by mouth daily. 90 tablet 1   No facility-administered medications prior to visit.    No Known Allergies     Objective:    BP 127/80   Pulse 71   Ht 5\' 1"  (1.549 m)   Wt 201 lb 6 oz (91.3 kg)   LMP 07/15/2021 (Exact Date)   SpO2 97%   BMI 38.05 kg/m  Wt Readings from Last 3 Encounters:  08/09/21 201 lb 6 oz (91.3 kg)  06/06/21 198 lb 12.8 oz (90.2 kg)  11/25/13 200 lb 6 oz (90.9 kg)    Physical Exam Vitals and nursing note reviewed.  Constitutional:      Appearance: She is well-developed.  HENT:     Head: Normocephalic and atraumatic.  Eyes:  Extraocular Movements: Extraocular movements intact.     Conjunctiva/sclera: Conjunctivae normal.  Cardiovascular:     Rate and Rhythm: Normal rate and regular rhythm.     Heart sounds: Normal heart sounds. No murmur heard.   No friction rub. No gallop.  Pulmonary:     Effort: Pulmonary effort is normal. No tachypnea or respiratory distress.     Breath sounds: Examination of the right-lower field reveals wheezing. Examination of the left-lower field reveals wheezing. Wheezing present. No decreased breath sounds, rhonchi or rales.  Chest:     Chest wall: No tenderness.  Abdominal:     General: Bowel sounds are normal.     Palpations: Abdomen is soft.  Musculoskeletal:        General: Normal range of motion.     Cervical back: Normal range of motion.  Skin:    General: Skin is warm and dry.  Neurological:     Mental Status: She is alert and oriented to person, place, and time.     Coordination: Coordination normal.  Psychiatric:        Behavior: Behavior normal. Behavior is cooperative.        Thought Content: Thought content normal.        Judgment: Judgment normal.         Patient has been counseled extensively about nutrition and  exercise as well as the importance of adherence with medications and regular follow-up. The patient was given clear instructions to go to ER or return to medical center if symptoms don't improve, worsen or new problems develop. The patient verbalized understanding.   Follow-up: Return for 4 weeks tele any tuesday in october/Headache. Schedule pap november/december.   Claiborne Rigg, FNP-BC [facility] [place], [place] [phone]   08/09/2021, 11:40 AM

## 2021-08-10 LAB — HCV AB W REFLEX TO QUANT PCR: HCV Ab: <0.1 s/co ratio (ref 0.0–0.9)

## 2021-08-10 LAB — LIPID PANEL
Chol/HDL Ratio: 4.3 ratio (ref 0.0–4.4)
Cholesterol, Total: 177 mg/dL (ref 100–199)
HDL: 41 mg/dL (ref >39)
LDL Chol Calc (NIH): 114 mg/dL — ABNORMAL HIGH (ref 0–99)
Triglycerides: 120 mg/dL (ref 0–149)
VLDL Cholesterol Cal: 22 mg/dL (ref 5–40)

## 2021-08-10 LAB — HIV ANTIBODY (ROUTINE TESTING W REFLEX): HIV Screen 4th Generation wRfx: NONREACTIVE

## 2021-08-10 LAB — HCV INTERPRETATION

## 2021-11-11 ENCOUNTER — Other Ambulatory Visit: Payer: Self-pay | Admitting: Physician Assistant

## 2021-11-11 DIAGNOSIS — T7840XD Allergy, unspecified, subsequent encounter: Secondary | ICD-10-CM

## 2021-11-11 DIAGNOSIS — J4520 Mild intermittent asthma, uncomplicated: Secondary | ICD-10-CM

## 2022-07-07 ENCOUNTER — Ambulatory Visit
Admission: RE | Admit: 2022-07-07 | Discharge: 2022-07-07 | Disposition: A | Discharge status: Home / Self Care | Payer: BLUE CROSS/BLUE SHIELD | Source: Ambulatory Visit | Attending: Urgent Care | Admitting: Urgent Care

## 2022-07-07 VITALS — BP 108/75 | HR 90 | Temp 98.7°F | Resp 18

## 2022-07-07 DIAGNOSIS — N3001 Acute cystitis with hematuria: Secondary | ICD-10-CM

## 2022-07-07 DIAGNOSIS — R350 Frequency of micturition: Secondary | ICD-10-CM

## 2022-07-07 LAB — POCT URINALYSIS DIP (MANUAL ENTRY)
Bilirubin, UA: NEGATIVE
Glucose, UA: NEGATIVE mg/dL
Ketones, POC UA: NEGATIVE mg/dL
Nitrite, UA: NEGATIVE
Protein Ur, POC: 100 mg/dL — AB
Spec Grav, UA: 1.015 (ref 1.010–1.025)
Urobilinogen, UA: 0.2 E.U./dL
pH, UA: 6.5 (ref 5.0–8.0)

## 2022-07-07 MED ORDER — CEPHALEXIN 500 MG PO CAPS: 500.0000 mg | ORAL_CAPSULE | Freq: Two times a day (BID) | ORAL | 0 refills | Status: DC

## 2022-07-07 NOTE — ED Provider Notes (Signed)
[facility]   MRN: [id number] DOB: [birth year]  Subjective:   Elaine James is a 46 y.o. female presenting for 2 day history of urinary frequency, dysuria, hematuria, lower abdominal pain. Denies vaginal discharge, fever, n/v. Hydrates well. Rarely has soda, has 1/2 cup of coffee.   No current facility-administered medications for this encounter.  Current Outpatient Medications:    albuterol (PROVENTIL) (2.5 MG/3ML) 0.083% nebulizer solution, Take 3 mLs (2.5 mg total) by nebulization every 6 (six) hours as needed for wheezing or shortness of breath., Disp: 75 mL, Rfl: 0   albuterol (VENTOLIN HFA) 108 (90 Base) MCG/ACT inhaler, Inhale 1-2 puffs into the lungs every 6 (six) hours as needed for wheezing or shortness of breath., Disp: 18 g, Rfl: 0   amitriptyline (ELAVIL) 25 MG tablet, Take 0.5-1 tablets (12.5-25 mg total) by mouth at bedtime., Disp: 30 tablet, Rfl: 3   budesonide-formoterol (SYMBICORT) 80-4.5 MCG/ACT inhaler, Inhale 2 puffs into the lungs in the morning and at bedtime. NEEDS [employer] if not covered by insurance, Disp: 10.2 g, Rfl: PRN   cetirizine (ZYRTEC) 10 MG tablet, Take 1 tablet (10 mg total) by mouth daily., Disp: 30 tablet, Rfl: 11   Ferrous Sulfate (IRON) 325 (65 Fe) MG TABS, Take 1 tablet (325 mg total) by mouth daily., Disp: 30 tablet, Rfl: 3   lisinopril-hydrochlorothiazide (ZESTORETIC) 10-12.5 MG tablet, Take 1 tablet by mouth daily., Disp: 90 tablet, Rfl: 1   montelukast (SINGULAIR) 10 MG tablet, TAKE 1 TABLET BY MOUTH EVERYDAY AT BEDTIME, Disp: 30 tablet, Rfl: 3   Multiple Vitamin (MULTIVITAMIN WITH MINERALS) TABS tablet, Take 1 tablet by mouth daily., Disp: , Rfl:    Respiratory Therapy Supplies (NEBULIZER) DEVI, 1 Device by Does not apply route every 4 (four) hours as needed., Disp: 1 each, Rfl: 0   No Known Allergies  Past Medical History:  Diagnosis Date   Asthma    Hypertension      Past Surgical History:   Procedure Laterality Date   APPENDECTOMY     TUBAL LIGATION      Family History  Problem Relation Age of Onset   Healthy Mother    Healthy Father     Social History   Tobacco Use   Smoking status: Never   Smokeless tobacco: Never  Vaping Use   Vaping Use: Never used  Substance Use Topics   Alcohol use: No   Drug use: No    ROS   Objective:   Vitals: BP 108/75   Pulse 90   Temp 98.7 F (37.1 C)   Resp 18   SpO2 99%   Physical Exam Constitutional:      General: She is not in acute distress.    Appearance: Normal appearance. She is well-developed. She is not ill-appearing, toxic-appearing or diaphoretic.  HENT:     Head: Normocephalic and atraumatic.     Nose: Nose normal.     Mouth/Throat:     Mouth: Mucous membranes are moist.     Pharynx: Oropharynx is clear.  Eyes:     General: No scleral icterus.       Right eye: No discharge.        Left eye: No discharge.     Extraocular Movements: Extraocular movements intact.     Conjunctiva/sclera: Conjunctivae normal.  Cardiovascular:     Rate and Rhythm: Normal rate.  Pulmonary:     Effort: Pulmonary effort is normal.  Abdominal:     General: Bowel  sounds are normal. There is no distension.     Palpations: Abdomen is soft. There is no mass.     Tenderness: There is no abdominal tenderness. There is no right CVA tenderness, left CVA tenderness, guarding or rebound.  Skin:    General: Skin is warm and dry.  Neurological:     General: No focal deficit present.     Mental Status: She is alert and oriented to person, place, and time.  Psychiatric:        Mood and Affect: Mood normal.        Behavior: Behavior normal.        Thought Content: Thought content normal.        Judgment: Judgment normal.     Results for orders placed or performed during the hospital encounter of 07/07/22 (from the past 24 hour(s))  POCT urinalysis dipstick     Status: Abnormal   Collection Time: 07/07/22  8:33 AM  Result  Value Ref Range   Color, UA yellow yellow   Clarity, UA cloudy (A) clear   Glucose, UA negative negative mg/dL   Bilirubin, UA negative negative   Ketones, POC UA negative negative mg/dL   Spec Grav, UA 0.623 7.628 - 1.025   Blood, UA large (A) negative   pH, UA 6.5 5.0 - 8.0   Protein Ur, POC =100 (A) negative mg/dL   Urobilinogen, UA 0.2 0.2 or 1.0 E.U./dL   Nitrite, UA Negative Negative   Leukocytes, UA Large (3+) (A) Negative    Assessment and Plan :   PDMP not reviewed this encounter.  1. Acute cystitis with hematuria   2. Urinary frequency    Start Keflex to cover for acute cystitis, urine culture pending.  Recommended aggressive hydration, limiting urinary irritants. Counseled patient on potential for adverse effects with medications prescribed/recommended today, ER and return-to-clinic precautions discussed, patient verbalized understanding.    Wallis Bamberg, [place] 07/07/22 [phone]

## 2022-07-07 NOTE — ED Triage Notes (Signed)
Pt here with increased urinary frequency, suprapubic pain and pressure and some drops of blood in her urine x 2 days.

## 2022-07-10 LAB — URINE CULTURE: Culture: >=100000 — AB

## 2022-11-11 ENCOUNTER — Ambulatory Visit
Admission: EM | Admit: 2022-11-11 | Discharge: 2022-11-11 | Disposition: A | Discharge status: Home / Self Care | Payer: BLUE CROSS/BLUE SHIELD | Attending: Physician Assistant | Admitting: Physician Assistant

## 2022-11-11 DIAGNOSIS — J45901 Unspecified asthma with (acute) exacerbation: Secondary | ICD-10-CM

## 2022-11-11 MED ORDER — AZITHROMYCIN 250 MG PO TABS: 250.0000 mg | ORAL_TABLET | Freq: Every day | ORAL | 0 refills | Status: DC

## 2022-11-11 MED ORDER — PREDNISONE 20 MG PO TABS: 40.0000 mg | ORAL_TABLET | Freq: Every day | ORAL | 0 refills | Status: AC

## 2022-11-11 NOTE — ED Provider Notes (Signed)
[facility]    CSN: [id number] Arrival date & time: 11/11/22  1131      History   Chief Complaint Chief Complaint  Patient presents with   URI   Cough    HPI Elaine James is a 47 y.o. female.   Patient here today for evaluation for cough, fatigue, congestion and shortness of breath that started 3 weeks ago. She has had some headache as well. She has tried using nebulizer at home without resolution of symptoms. She has not taken any OTC meds. No fever this week but did have fever prior. She is unsure of how high fever was. Her husband is also sick and has been for about the same amount of time. They are unsure what he has. Denies any nausea, vomiting or diarrhea. Cough is sometimes productive.   The history is provided by the patient. The history is limited by a language barrier. Language interpreter used: [id number].  URI Presenting symptoms: congestion, cough, ear pain, fatigue and sore throat   Presenting symptoms: no fever   Associated symptoms: wheezing   Cough Associated symptoms: ear pain, shortness of breath, sore throat and wheezing   Associated symptoms: no chills, no eye discharge and no fever     Past Medical History:  Diagnosis Date   Asthma    Hypertension     There are no problems to display for this patient.   Past Surgical History:  Procedure Laterality Date   APPENDECTOMY     TUBAL LIGATION      OB History     Gravida  2   Para  2   Term  2   Preterm      AB      Living  2      SAB      IAB      Ectopic      Multiple      Live Births               Home Medications    Prior to Admission medications   Medication Sig Start Date End Date Taking? Authorizing Provider  azithromycin (ZITHROMAX) 250 MG tablet Take 1 tablet (250 mg total) by mouth daily. Take first 2 tablets together, then 1 every day until finished. 11/11/22  Yes Tomi Bamberger, PA-C  predniSONE (DELTASONE) 20 MG tablet  Take 2 tablets (40 mg total) by mouth daily with breakfast for 5 days. 11/11/22 11/16/22 Yes Tomi Bamberger, PA-C  albuterol (PROVENTIL) (2.5 MG/3ML) 0.083% nebulizer solution Take 3 mLs (2.5 mg total) by nebulization every 6 (six) hours as needed for wheezing or shortness of breath. 08/09/21   Claiborne Rigg, NP  albuterol (VENTOLIN HFA) 108 (90 Base) MCG/ACT inhaler Inhale 1-2 puffs into the lungs every 6 (six) hours as needed for wheezing or shortness of breath. 08/09/21   Claiborne Rigg, NP  amitriptyline (ELAVIL) 25 MG tablet Take 0.5-1 tablets (12.5-25 mg total) by mouth at bedtime. 08/09/21 09/08/21  Claiborne Rigg, NP  budesonide-formoterol (SYMBICORT) 80-4.5 MCG/ACT inhaler Inhale 2 puffs into the lungs in the morning and at bedtime. NEEDS [employer] if not covered by insurance 08/09/21   Claiborne Rigg, NP  cephALEXin (KEFLEX) 500 MG capsule Take 1 capsule (500 mg total) by mouth 2 (two) times daily. 07/07/22   Wallis Bamberg, PA-C  cetirizine (ZYRTEC) 10 MG tablet Take 1 tablet (10 mg total) by mouth daily. 08/09/21   Claiborne Rigg, NP  Ferrous Sulfate (IRON) 325 (65 Fe) MG TABS Take 1 tablet (325 mg total) by mouth daily. 08/09/21   Claiborne Rigg, NP  lisinopril-hydrochlorothiazide (ZESTORETIC) 10-12.5 MG tablet Take 1 tablet by mouth daily. 08/09/21 09/08/21  Claiborne Rigg, NP  montelukast (SINGULAIR) 10 MG tablet TAKE 1 TABLET BY MOUTH EVERYDAY AT BEDTIME 11/11/21   Claiborne Rigg, NP  Multiple Vitamin (MULTIVITAMIN WITH MINERALS) TABS tablet Take 1 tablet by mouth daily.    [provider]  Respiratory Therapy Supplies (NEBULIZER) DEVI 1 Device by Does not apply route every 4 (four) hours as needed. 07/26/20   Hall-Potvin, [place], PA-C  beclomethasone (QVAR) 80 MCG/ACT inhaler Inhale into the lungs. 08/17/19 11/12/19  [provider]  simvastatin (ZOCOR) 20 MG tablet Take by mouth. 08/17/19 11/12/19  [provider]    Family History Family History   Problem Relation Age of Onset   Healthy Mother    Healthy Father     Social History Social History   Tobacco Use   Smoking status: Never   Smokeless tobacco: Never  Vaping Use   Vaping Use: Never used  Substance Use Topics   Alcohol use: No   Drug use: No     Allergies   Patient has no known allergies.   Review of Systems Review of Systems  Constitutional:  Positive for fatigue. Negative for chills and fever.  HENT:  Positive for congestion, ear pain and sore throat.   Eyes:  Negative for discharge and redness.  Respiratory:  Positive for cough, shortness of breath and wheezing.   Gastrointestinal:  Negative for diarrhea, nausea and vomiting.     Physical Exam Triage Vital Signs ED Triage Vitals  Enc Vitals Group     BP 11/11/22 1325 (!) 129/90     Pulse Rate 11/11/22 1325 92     Resp 11/11/22 1325 16     Temp 11/11/22 1325 98.2 F (36.8 C)     Temp src --      SpO2 11/11/22 1325 98 %     Weight --      Height --      Head Circumference --      Peak Flow --      Pain Score 11/11/22 1324 6     Pain Loc --      Pain Edu? --      Excl. in GC? --    No data found.  Updated Vital Signs BP (!) 129/90   Pulse 92   Temp 98.2 F (36.8 C)   Resp 16   LMP 10/19/2022   SpO2 98%     Physical Exam Vitals and nursing note reviewed.  Constitutional:      General: She is not in acute distress.    Appearance: Normal appearance. She is not ill-appearing.  HENT:     Head: Normocephalic and atraumatic.     Right Ear: Tympanic membrane normal.     Left Ear: Tympanic membrane normal.     Nose: Congestion present.     Mouth/Throat:     Mouth: Mucous membranes are moist.     Pharynx: No oropharyngeal exudate or posterior oropharyngeal erythema.  Eyes:     Conjunctiva/sclera: Conjunctivae normal.  Cardiovascular:     Rate and Rhythm: Normal rate and regular rhythm.     Heart sounds: Normal heart sounds. No murmur heard. Pulmonary:     Effort: Pulmonary  effort is normal. No respiratory distress.     Breath sounds:  Wheezing (mild diffuse) present. No rhonchi or rales.  Skin:    General: Skin is warm and dry.  Neurological:     Mental Status: She is alert.  Psychiatric:        Mood and Affect: Mood normal.        Thought Content: Thought content normal.      UC Treatments / Results  Labs (all labs ordered are listed, but only abnormal results are displayed) Labs Reviewed - No data to display  EKG   Radiology No results found.  Procedures Procedures (including critical care time)  Medications Ordered in UC Medications - No data to display  Initial Impression / Assessment and Plan / UC Course  I have reviewed the triage vital signs and the nursing notes.  Pertinent labs & imaging results that were available during my care of the patient were reviewed by me and considered in my medical decision making (see chart for details).    Steroid burst and Zpak prescribed for treatment of suspected asthma exacerbation. Encouraged follow up if no gradual improvement or with any further concerns.   Final Clinical Impressions(s) / UC Diagnoses   Final diagnoses:  Asthma with acute exacerbation, unspecified asthma severity, unspecified whether persistent   Discharge Instructions   None    ED Prescriptions     Medication Sig Dispense Auth. Provider   predniSONE (DELTASONE) 20 MG tablet Take 2 tablets (40 mg total) by mouth daily with breakfast for 5 days. 10 tablet Erma Pinto F, PA-C   azithromycin (ZITHROMAX) 250 MG tablet Take 1 tablet (250 mg total) by mouth daily. Take first 2 tablets together, then 1 every day until finished. 6 tablet Tomi Bamberger, PA-C      PDMP not reviewed this encounter.   Tomi Bamberger, PA-C 11/11/22 [id number]

## 2022-11-11 NOTE — ED Triage Notes (Signed)
Pt presents to uc with co of cough x 3 weeks with congestion fatigue and sob, and ha Pt reports using nebulizer at home. Pt reports no otc medications. She reports her husband is sick as well.      Triage completed [phone] with ams

## 2023-10-28 ENCOUNTER — Ambulatory Visit
Admission: EM | Admit: 2023-10-28 | Discharge: 2023-10-28 | Disposition: A | Discharge status: Home / Self Care | Payer: BLUE CROSS/BLUE SHIELD | Attending: Family Medicine | Admitting: Family Medicine

## 2023-10-28 DIAGNOSIS — J4521 Mild intermittent asthma with (acute) exacerbation: Secondary | ICD-10-CM | POA: Diagnosis not present

## 2023-10-28 DIAGNOSIS — J0100 Acute maxillary sinusitis, unspecified: Secondary | ICD-10-CM | POA: Diagnosis not present

## 2023-10-28 DIAGNOSIS — J069 Acute upper respiratory infection, unspecified: Secondary | ICD-10-CM

## 2023-10-28 MED ORDER — AMOXICILLIN 875 MG PO TABS
875.0000 mg | ORAL_TABLET | Freq: Two times a day (BID) | ORAL | 0 refills | Status: AC
Start: 2023-10-28 — End: 2023-11-07

## 2023-10-28 MED ORDER — PREDNISONE 20 MG PO TABS: 40.0000 mg | ORAL_TABLET | Freq: Every day | ORAL | 0 refills | Status: DC

## 2023-10-28 MED ORDER — PROMETHAZINE-DM 6.25-15 MG/5ML PO SYRP
5.0000 mL | ORAL_SOLUTION | Freq: Four times a day (QID) | ORAL | 0 refills | Status: AC | PRN
Start: 2023-10-28 — End: ?

## 2023-10-28 NOTE — ED Triage Notes (Addendum)
Patient presents with symptoms of "feeling bad, felt hot like I had a fever.", dizziness, sinus headache, nausea and upper lung pain. Symptoms started on Wednesday. Treated with Tylenol, Nyquil and Inhaler with minimal relief.   Patient states her left 2 fingers felt stiff and left foot felt numbness a few days ago, wanted to know if potasium is low.

## 2023-10-29 NOTE — ED Provider Notes (Signed)
[facility]   [id number] 10/28/23 Arrival Time: 1551  ASSESSMENT & PLAN:  1. Viral URI with cough   2. Mild intermittent asthma with acute exacerbation   3. Acute non-recurrent maxillary sinusitis    Discussed typical duration of likely viral illness triggering asthma exacerbation. Suspect sinus infection also. OTC symptom care as needed.  Discharge Medication List as of 10/28/2023  4:40 PM     START taking these medications   Details  amoxicillin (AMOXIL) 875 MG tablet Take 1 tablet (875 mg total) by mouth 2 (two) times daily for 10 days., Starting Wed 10/28/2023, Until Sat 11/07/2023, Normal    predniSONE (DELTASONE) 20 MG tablet Take 2 tablets (40 mg total) by mouth daily., Starting Wed 10/28/2023, Normal    promethazine-dextromethorphan (PROMETHAZINE-DM) 6.25-15 MG/5ML syrup Take 5 mLs by mouth 4 (four) times daily as needed for cough., Starting Wed 10/28/2023, Normal         Follow-up Information     Claiborne Rigg, NP.   Specialty: Nurse Practitioner Why: As needed. Contact information: [address] [place] [address] [place] [address] [phone]         [facility] at [facility]).   Specialty: Urgent Care Why: If worsening or failing to improve as anticipated. Contact information: [address] [place] [address] [phone]                Reviewed expectations re: course of current medical issues. Questions answered. Outlined signs and symptoms indicating need for more acute intervention. Understanding verbalized. After Visit Summary given.   SUBJECTIVE: History from: Patient. Elaine James is a 47 y.o. female. Reports: recent nasal congestion, cough, chest congestion; past week or so. Now wheezing and with facial pressure "under cheeks". Inhaler with some help regarding wheezing. Denies: fever. Normal PO intake without n/v/d.  OBJECTIVE:  Vitals:    10/28/23 1621 10/28/23 1625  BP:  116/68  Pulse:  80  Resp:  18  Temp:  98 F (36.7 C)  TempSrc:  Oral  SpO2:  98%  Weight: 81.6 kg   Height: 5\' 4"  (1.626 m)     General appearance: alert; no distress Eyes: PERRLA; EOMI; conjunctiva normal HENT: [place]; AT; with nasal congestion; bilateral maxillary sinus TTP Neck: supple  Lungs: speaks full sentences without difficulty; unlabored; bilateral exp wheezing Extremities: no edema Skin: warm and dry Neurologic: normal gait Psychological: alert and cooperative; normal mood and affect   No Known Allergies  Past Medical History:  Diagnosis Date   Asthma    Hypertension    Social History   Socioeconomic History   Marital status: Single    Spouse name: Not on file   Number of children: Not on file   Years of education: Not on file   Highest education level: Not on file  Occupational History   Not on file  Tobacco Use   Smoking status: Never   Smokeless tobacco: Never  Vaping Use   Vaping status: Never Used  Substance and Sexual Activity   Alcohol use: No   Drug use: No   Sexual activity: Not on file  Other Topics Concern   Not on file  Social History Narrative   Not on file   Social Determinants of Health   Financial Resource Strain: Not on file  Food Insecurity: Not on file  Transportation Needs: Not on file  Physical Activity: Not on file  Stress: Not on file  Social Connections:  Not on file  Intimate Partner Violence: Not on file   Family History  Problem Relation Age of Onset   Healthy Mother    Healthy Father    Past Surgical History:  Procedure Laterality Date   APPENDECTOMY     TUBAL LIGATION       Mardella Layman, MD 10/29/23 [phone]

## 2023-12-15 ENCOUNTER — Other Ambulatory Visit: Payer: Self-pay

## 2023-12-15 ENCOUNTER — Ambulatory Visit
Admission: EM | Admit: 2023-12-15 | Discharge: 2023-12-15 | Disposition: A | Discharge status: Home / Self Care | Payer: BLUE CROSS/BLUE SHIELD | Attending: Family Medicine | Admitting: Family Medicine

## 2023-12-15 ENCOUNTER — Encounter: Payer: Self-pay | Admitting: Emergency Medicine

## 2023-12-15 DIAGNOSIS — J4521 Mild intermittent asthma with (acute) exacerbation: Secondary | ICD-10-CM

## 2023-12-15 DIAGNOSIS — T7840XD Allergy, unspecified, subsequent encounter: Secondary | ICD-10-CM | POA: Diagnosis not present

## 2023-12-15 DIAGNOSIS — J4520 Mild intermittent asthma, uncomplicated: Secondary | ICD-10-CM | POA: Diagnosis not present

## 2023-12-15 DIAGNOSIS — J069 Acute upper respiratory infection, unspecified: Secondary | ICD-10-CM

## 2023-12-15 MED ORDER — HYDROCODONE BIT-HOMATROP MBR 5-1.5 MG/5ML PO SOLN: 5.0000 mL | Freq: Four times a day (QID) | ORAL | 0 refills | Status: AC | PRN

## 2023-12-15 MED ORDER — ALBUTEROL SULFATE HFA 108 (90 BASE) MCG/ACT IN AERS: 1.0000 | INHALATION_SPRAY | Freq: Four times a day (QID) | RESPIRATORY_TRACT | 2 refills | Status: AC | PRN

## 2023-12-15 MED ORDER — PREDNISONE 20 MG PO TABS: 40.0000 mg | ORAL_TABLET | Freq: Every day | ORAL | 0 refills | Status: AC

## 2023-12-15 NOTE — ED Triage Notes (Signed)
Pt here for cough, fever and congestion x 4-5 days

## 2023-12-15 NOTE — ED Provider Notes (Signed)
[facility]   [id number] 12/15/23 Arrival Time: 0935  ASSESSMENT & PLAN:  1. Viral URI with cough   2. Mild intermittent asthma with acute exacerbation   3. Mild intermittent asthma, unspecified whether complicated   4. Allergy, subsequent encounter    Without resp distress here. Discussed typical duration of likely viral illness as trigger. OTC symptom care as needed.  Meds ordered this encounter  Medications   albuterol (VENTOLIN HFA) 108 (90 Base) MCG/ACT inhaler    Sig: Inhale 1-2 puffs into the lungs every 6 (six) hours as needed for wheezing or shortness of breath.    Dispense:  18 g    Refill:  2   predniSONE (DELTASONE) 20 MG tablet    Sig: Take 2 tablets (40 mg total) by mouth daily.    Dispense:  10 tablet    Refill:  0   HYDROcodone bit-homatropine (HYCODAN) 5-1.5 MG/5ML syrup    Sig: Take 5 mLs by mouth every 6 (six) hours as needed for cough.    Dispense:  90 mL    Refill:  0     Follow-up Information     [facility]).   Specialty: Urgent Care Why: If worsening or failing to improve as anticipated. Contact information: [address] [place] [address] [phone]                Reviewed expectations re: course of current medical issues. Questions answered. Outlined signs and symptoms indicating need for more acute intervention. Understanding verbalized. After Visit Summary given.   SUBJECTIVE: History from: Patient. Elaine James is a 48 y.o. female. Reports: cough, subj fever, chest congestion; abrupt onset approx 4 d ago; now wheezing. Denies current SOB/CP. Normal PO intake without n/v/d.  OBJECTIVE:  Vitals:   12/15/23 1143  BP: 105/76  Pulse: 91  Resp: 18  Temp: 98.9 F (37.2 C)  TempSrc: Oral  SpO2: 98%    General appearance: alert; no distress Eyes: PERRLA; EOMI; conjunctiva normal HENT: [place]; AT; with nasal congestion Neck:  supple  Lungs: speaks full sentences without difficulty; unlabored; bilat exp wheezing present Extremities: no edema Skin: warm and dry Neurologic: normal gait Psychological: alert and cooperative; normal mood and affect   No Known Allergies  Past Medical History:  Diagnosis Date   Asthma    Hypertension    Social History   Socioeconomic History   Marital status: Single    Spouse name: Not on file   Number of children: Not on file   Years of education: Not on file   Highest education level: Not on file  Occupational History   Not on file  Tobacco Use   Smoking status: Never   Smokeless tobacco: Never  Vaping Use   Vaping status: Never Used  Substance and Sexual Activity   Alcohol use: No   Drug use: No   Sexual activity: Not on file  Other Topics Concern   Not on file  Social History Narrative   Not on file   Social Drivers of Health   Financial Resource Strain: Not on file  Food Insecurity: Not on file  Transportation Needs: Not on file  Physical Activity: Not on file  Stress: Not on file  Social Connections: Not on file  Intimate Partner Violence: Not on file   Family History  Problem Relation Age of Onset   Healthy Mother    Healthy Father    Past Surgical  History:  Procedure Laterality Date   APPENDECTOMY     TUBAL LIGATION       Mardella Layman, MD 12/15/23 1407
